# Patient Record
Sex: Female | Born: 1980 | Race: Black or African American | Hispanic: No | Marital: Single | Smoking: Never smoker
Health system: Southern US, Community
[De-identification: ages and names within clinical notes are randomized; demographics above are authoritative.]

## PROBLEM LIST (undated history)

## (undated) DIAGNOSIS — G43909 Migraine, unspecified, not intractable, without status migrainosus: Secondary | ICD-10-CM

## (undated) DIAGNOSIS — D649 Anemia, unspecified: Secondary | ICD-10-CM

## (undated) DIAGNOSIS — B029 Zoster without complications: Secondary | ICD-10-CM

## (undated) HISTORY — DX: Zoster without complications: B02.9

## (undated) HISTORY — PX: OTHER SURGICAL HISTORY: SHX169

---

## 2007-06-21 ENCOUNTER — Emergency Department (HOSPITAL_COMMUNITY): Admission: EM | Admit: 2007-06-21 | Discharge: 2007-06-21 | Payer: Self-pay | Admitting: Emergency Medicine

## 2008-02-25 ENCOUNTER — Emergency Department (HOSPITAL_COMMUNITY): Admission: EM | Admit: 2008-02-25 | Discharge: 2008-02-25 | Payer: Self-pay | Admitting: Emergency Medicine

## 2008-04-10 ENCOUNTER — Emergency Department (HOSPITAL_COMMUNITY): Admission: EM | Admit: 2008-04-10 | Discharge: 2008-04-10 | Payer: Self-pay | Admitting: Emergency Medicine

## 2008-07-30 ENCOUNTER — Emergency Department (HOSPITAL_COMMUNITY): Admission: EM | Admit: 2008-07-30 | Discharge: 2008-07-30 | Payer: Self-pay | Admitting: Emergency Medicine

## 2008-12-10 ENCOUNTER — Emergency Department (HOSPITAL_COMMUNITY): Admission: EM | Admit: 2008-12-10 | Discharge: 2008-12-10 | Payer: Self-pay | Admitting: Emergency Medicine

## 2009-05-01 ENCOUNTER — Ambulatory Visit: Payer: Self-pay | Admitting: Family Medicine

## 2009-07-26 ENCOUNTER — Emergency Department (HOSPITAL_COMMUNITY): Admission: EM | Admit: 2009-07-26 | Discharge: 2009-07-26 | Payer: Self-pay | Admitting: Emergency Medicine

## 2009-10-31 ENCOUNTER — Emergency Department (HOSPITAL_COMMUNITY): Admission: EM | Admit: 2009-10-31 | Discharge: 2009-10-31 | Payer: Self-pay | Admitting: Emergency Medicine

## 2009-11-01 ENCOUNTER — Emergency Department (HOSPITAL_COMMUNITY): Admission: EM | Admit: 2009-11-01 | Discharge: 2009-11-01 | Payer: Self-pay | Admitting: Emergency Medicine

## 2010-01-06 ENCOUNTER — Emergency Department (HOSPITAL_COMMUNITY): Admission: EM | Admit: 2010-01-06 | Discharge: 2010-01-06 | Payer: Self-pay | Admitting: Emergency Medicine

## 2010-03-07 ENCOUNTER — Emergency Department (HOSPITAL_COMMUNITY)
Admission: EM | Admit: 2010-03-07 | Discharge: 2010-03-07 | Payer: Self-pay | Source: Home / Self Care | Admitting: Emergency Medicine

## 2010-03-30 ENCOUNTER — Emergency Department (HOSPITAL_COMMUNITY)
Admission: EM | Admit: 2010-03-30 | Discharge: 2010-03-31 | Payer: Self-pay | Source: Home / Self Care | Admitting: Emergency Medicine

## 2010-04-01 ENCOUNTER — Emergency Department (HOSPITAL_COMMUNITY)
Admission: EM | Admit: 2010-04-01 | Discharge: 2010-04-01 | Payer: Self-pay | Source: Home / Self Care | Admitting: Emergency Medicine

## 2010-04-01 LAB — URINE MICROSCOPIC-ADD ON

## 2010-04-01 LAB — URINALYSIS, ROUTINE W REFLEX MICROSCOPIC
Bilirubin Urine: NEGATIVE
Ketones, ur: NEGATIVE mg/dL
Leukocytes, UA: NEGATIVE
Nitrite: NEGATIVE
Protein, ur: NEGATIVE mg/dL
Specific Gravity, Urine: 1.017 (ref 1.005–1.030)
Urine Glucose, Fasting: NEGATIVE mg/dL
Urobilinogen, UA: 0.2 mg/dL (ref 0.0–1.0)
pH: 6.5 (ref 5.0–8.0)

## 2010-04-03 LAB — URINE CULTURE
Colony Count: 45000
Colony Count: NO GROWTH
Culture  Setup Time: 201201151141
Culture  Setup Time: 201201161814
Culture: NO GROWTH

## 2010-04-03 LAB — URINALYSIS, ROUTINE W REFLEX MICROSCOPIC
Bilirubin Urine: NEGATIVE
Hgb urine dipstick: NEGATIVE
Ketones, ur: NEGATIVE mg/dL
Nitrite: NEGATIVE
Protein, ur: NEGATIVE mg/dL
Specific Gravity, Urine: 1.016 (ref 1.005–1.030)
Urine Glucose, Fasting: NEGATIVE mg/dL
Urobilinogen, UA: 0.2 mg/dL (ref 0.0–1.0)
pH: 7.5 (ref 5.0–8.0)

## 2010-04-03 LAB — POCT PREGNANCY, URINE: Preg Test, Ur: NEGATIVE

## 2010-04-15 ENCOUNTER — Other Ambulatory Visit: Payer: Self-pay | Admitting: Family Medicine

## 2010-04-15 DIAGNOSIS — M7989 Other specified soft tissue disorders: Secondary | ICD-10-CM

## 2010-04-23 ENCOUNTER — Other Ambulatory Visit: Payer: Self-pay | Admitting: Family Medicine

## 2010-04-23 ENCOUNTER — Other Ambulatory Visit: Payer: Self-pay | Admitting: Diagnostic Radiology

## 2010-04-23 ENCOUNTER — Ambulatory Visit
Admission: RE | Admit: 2010-04-23 | Discharge: 2010-04-23 | Disposition: A | Discharge status: Home / Self Care | Payer: Self-pay | Source: Ambulatory Visit | Attending: Family Medicine | Admitting: Family Medicine

## 2010-04-23 DIAGNOSIS — M7989 Other specified soft tissue disorders: Secondary | ICD-10-CM

## 2010-05-03 ENCOUNTER — Ambulatory Visit (HOSPITAL_BASED_OUTPATIENT_CLINIC_OR_DEPARTMENT_OTHER)
Admission: RE | Admit: 2010-05-03 | Discharge: 2010-05-03 | Disposition: A | Discharge status: Home / Self Care | Payer: Self-pay | Source: Ambulatory Visit | Attending: Surgery | Admitting: Surgery

## 2010-05-03 ENCOUNTER — Other Ambulatory Visit: Payer: Self-pay | Admitting: Surgery

## 2010-05-03 DIAGNOSIS — D249 Benign neoplasm of unspecified breast: Secondary | ICD-10-CM | POA: Insufficient documentation

## 2010-05-03 DIAGNOSIS — Z01812 Encounter for preprocedural laboratory examination: Secondary | ICD-10-CM | POA: Insufficient documentation

## 2010-05-03 DIAGNOSIS — J45909 Unspecified asthma, uncomplicated: Secondary | ICD-10-CM | POA: Insufficient documentation

## 2010-05-03 LAB — POCT HEMOGLOBIN-HEMACUE: Hemoglobin: 13.5 g/dL (ref 12.0–15.0)

## 2010-05-07 NOTE — Op Note (Signed)
  NAMESATCHA, STORLIE               ACCOUNT NO.:  [id number]  MEDICAL RECORD NO.:  [id number]           PATIENT TYPE:  LOCATION:                                 FACILITY:  PHYSICIAN:  Currie Paris, M.D.DATE OF BIRTH:  Jun 05, 1980  DATE OF PROCEDURE:  05/03/2010 DATE OF DISCHARGE:                              OPERATIVE REPORT   PREOPERATIVE DIAGNOSIS:  Spindle cell tumor, right axilla.  POSTOPERATIVE DIAGNOSIS:  Spindle cell tumor, right axilla.  OPERATION:  Removal of spindle cell tumor, right axilla.  SURGEON:  Currie Paris, MD  ANESTHESIA:  General.  CLINICAL HISTORY:  This is a 29-year lady who was mostly developed a well-circumscribed firm but not rock-hard mass in the right axilla.  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  The patient had no other abnormalities noted.  DESCRIPTION OF PROCEDURE:  I saw the patient in the holding area and marked the right axilla as the operative site.  She had no further questions, so we proceeded to the operating room.  In the operating room, I was able to extend her arm and marked the exact mass which was actually visible with her arm extended.  Once that was done, satisfactory general anesthesia was obtained and the right axillary area were prepped and draped.  A time-out was done.  I put some 0.25% plain Marcaine in to help with postop pain relief.  I made an incision in the axilla directly over the mass.  Initially, I thought it was very superficial, but as I went a little deeper I found that it was just under the superficial fascia, so I opened that.  As I began to dissect this off, it was clearly well circumscribed mass that was somewhat pale to whitish-yellow.  It was also noted that it looked like some of the axillary brachial plexus was draped a little bit over this and it was sitting basically on the top of the axillary vein  and axillary artery.  Therefore, I was able to dissect this off very carefully without causing any damage to the nerves or artery and it came out intact.  I made sure everything was dry.  There was a small blood vessel that I put 2 clips on, but there was no significant active bleeding.  I elected not to put any further deep local in for fear of anesthetizing the brachial plexus.  Once everything was dry, I closed in layers with 3-0 Vicryl, 4-0 Monocryl subcuticular, and Dermabond.  The patient tolerated the procedure well.  There were no complications. All counts were correct.     Currie Paris, M.D.     CJS/MEDQ  D:  05/03/2010  T:  05/04/2010  Job:  [id number]  cc:   [facility]. Shawnie Pons, M.D.  Electronically Signed by Cyndia Bent M.D. on 05/07/2010 08:35:51 AM

## 2010-05-31 LAB — PREGNANCY, URINE: Preg Test, Ur: NEGATIVE

## 2010-06-04 LAB — DIFFERENTIAL
Basophils Absolute: 0 10*3/uL (ref 0.0–0.1)
Basophils Relative: 0 % (ref 0–1)
Eosinophils Absolute: 0 10*3/uL (ref 0.0–0.7)
Eosinophils Relative: 0 % (ref 0–5)
Lymphocytes Relative: 17 % (ref 12–46)
Lymphs Abs: 1.4 10*3/uL (ref 0.7–4.0)
Monocytes Absolute: 0.5 10*3/uL (ref 0.1–1.0)
Monocytes Relative: 6 % (ref 3–12)
Neutro Abs: 6.5 10*3/uL (ref 1.7–7.7)
Neutrophils Relative %: 77 % (ref 43–77)

## 2010-06-04 LAB — COMPREHENSIVE METABOLIC PANEL
ALT: 34 U/L (ref 0–35)
AST: 18 U/L (ref 0–37)
Albumin: 4 g/dL (ref 3.5–5.2)
Alkaline Phosphatase: 53 U/L (ref 39–117)
BUN: 8 mg/dL (ref 6–23)
CO2: 26 mEq/L (ref 19–32)
Calcium: 9.7 mg/dL (ref 8.4–10.5)
Chloride: 106 mEq/L (ref 96–112)
Creatinine, Ser: 0.53 mg/dL (ref 0.4–1.2)
GFR calc Af Amer: >60 mL/min (ref >60)
GFR calc non Af Amer: >60 mL/min (ref >60)
Glucose, Bld: 84 mg/dL (ref 70–99)
Potassium: 3.9 mEq/L (ref 3.5–5.1)
Sodium: 138 mEq/L (ref 135–145)
Total Bilirubin: 0.9 mg/dL (ref 0.3–1.2)
Total Protein: 7 g/dL (ref 6.0–8.3)

## 2010-06-04 LAB — CBC
HCT: 39.5 % (ref 36.0–46.0)
Hemoglobin: 13.8 g/dL (ref 12.0–15.0)
MCHC: 34.8 g/dL (ref 30.0–36.0)
MCV: 90.5 fL (ref 78.0–100.0)
Platelets: 300 10*3/uL (ref 150–400)
RBC: 4.37 MIL/uL (ref 3.87–5.11)
RDW: 12.9 % (ref 11.5–15.5)
WBC: 8.5 10*3/uL (ref 4.0–10.5)

## 2010-06-04 LAB — URINE MICROSCOPIC-ADD ON

## 2010-06-04 LAB — URINE CULTURE: Colony Count: 75000

## 2010-06-04 LAB — URINALYSIS, ROUTINE W REFLEX MICROSCOPIC
Bilirubin Urine: NEGATIVE
Glucose, UA: NEGATIVE mg/dL
Ketones, ur: NEGATIVE mg/dL
Nitrite: NEGATIVE
Protein, ur: NEGATIVE mg/dL
Specific Gravity, Urine: 1.036 — ABNORMAL HIGH (ref 1.005–1.030)
Urobilinogen, UA: 0.2 mg/dL (ref 0.0–1.0)
pH: 6 (ref 5.0–8.0)

## 2010-06-04 LAB — POCT PREGNANCY, URINE: Preg Test, Ur: NEGATIVE

## 2010-06-04 LAB — LIPASE, BLOOD: Lipase: 22 U/L (ref 11–59)

## 2010-06-15 ENCOUNTER — Emergency Department (HOSPITAL_COMMUNITY)
Admission: EM | Admit: 2010-06-15 | Discharge: 2010-06-15 | Disposition: A | Discharge status: Home / Self Care | Payer: Self-pay | Attending: Emergency Medicine | Admitting: Emergency Medicine

## 2010-06-15 ENCOUNTER — Emergency Department (HOSPITAL_COMMUNITY): Payer: Self-pay

## 2010-06-15 DIAGNOSIS — R110 Nausea: Secondary | ICD-10-CM | POA: Insufficient documentation

## 2010-06-15 DIAGNOSIS — J45909 Unspecified asthma, uncomplicated: Secondary | ICD-10-CM | POA: Insufficient documentation

## 2010-06-15 DIAGNOSIS — G43909 Migraine, unspecified, not intractable, without status migrainosus: Secondary | ICD-10-CM | POA: Insufficient documentation

## 2010-06-25 LAB — URINALYSIS, ROUTINE W REFLEX MICROSCOPIC
Bilirubin Urine: NEGATIVE
Glucose, UA: NEGATIVE mg/dL
Ketones, ur: NEGATIVE mg/dL
Nitrite: NEGATIVE
Protein, ur: NEGATIVE mg/dL
Specific Gravity, Urine: 1.03 (ref 1.005–1.030)
Urobilinogen, UA: 1 mg/dL (ref 0.0–1.0)
pH: 6.5 (ref 5.0–8.0)

## 2010-06-25 LAB — POCT I-STAT, CHEM 8
BUN: 9 mg/dL (ref 6–23)
Calcium, Ion: 1.05 mmol/L — ABNORMAL LOW (ref 1.12–1.32)
Chloride: 107 mEq/L (ref 96–112)
Creatinine, Ser: 0.6 mg/dL (ref 0.4–1.2)
Glucose, Bld: 88 mg/dL (ref 70–99)
HCT: 44 % (ref 36.0–46.0)
Hemoglobin: 15 g/dL (ref 12.0–15.0)
Potassium: 3.6 mEq/L (ref 3.5–5.1)
Sodium: 140 mEq/L (ref 135–145)
TCO2: 25 mmol/L (ref 0–100)

## 2010-06-25 LAB — URINE MICROSCOPIC-ADD ON

## 2010-06-25 LAB — POCT PREGNANCY, URINE: Preg Test, Ur: NEGATIVE

## 2010-08-05 ENCOUNTER — Encounter (INDEPENDENT_AMBULATORY_CARE_PROVIDER_SITE_OTHER): Payer: Self-pay | Admitting: Surgery

## 2010-09-19 ENCOUNTER — Emergency Department (HOSPITAL_COMMUNITY)
Admission: EM | Admit: 2010-09-19 | Discharge: 2010-09-20 | Disposition: A | Discharge status: Home / Self Care | Payer: Self-pay | Attending: Emergency Medicine | Admitting: Emergency Medicine

## 2010-09-19 DIAGNOSIS — G43909 Migraine, unspecified, not intractable, without status migrainosus: Secondary | ICD-10-CM | POA: Insufficient documentation

## 2010-09-19 DIAGNOSIS — H53149 Visual discomfort, unspecified: Secondary | ICD-10-CM | POA: Insufficient documentation

## 2010-09-19 DIAGNOSIS — R112 Nausea with vomiting, unspecified: Secondary | ICD-10-CM | POA: Insufficient documentation

## 2010-10-12 ENCOUNTER — Emergency Department (HOSPITAL_COMMUNITY)
Admission: EM | Admit: 2010-10-12 | Discharge: 2010-10-12 | Disposition: A | Discharge status: Home / Self Care | Payer: Self-pay | Attending: Emergency Medicine | Admitting: Emergency Medicine

## 2010-10-12 DIAGNOSIS — R221 Localized swelling, mass and lump, neck: Secondary | ICD-10-CM | POA: Insufficient documentation

## 2010-10-12 DIAGNOSIS — J45909 Unspecified asthma, uncomplicated: Secondary | ICD-10-CM | POA: Insufficient documentation

## 2010-10-12 DIAGNOSIS — K029 Dental caries, unspecified: Secondary | ICD-10-CM | POA: Insufficient documentation

## 2010-10-12 DIAGNOSIS — R220 Localized swelling, mass and lump, head: Secondary | ICD-10-CM | POA: Insufficient documentation

## 2010-10-12 DIAGNOSIS — K089 Disorder of teeth and supporting structures, unspecified: Secondary | ICD-10-CM | POA: Insufficient documentation

## 2010-10-28 ENCOUNTER — Emergency Department (HOSPITAL_COMMUNITY)
Admission: EM | Admit: 2010-10-28 | Discharge: 2010-10-28 | Disposition: A | Discharge status: Home / Self Care | Payer: Self-pay | Attending: Emergency Medicine | Admitting: Emergency Medicine

## 2010-10-28 DIAGNOSIS — R51 Headache: Secondary | ICD-10-CM | POA: Insufficient documentation

## 2010-10-28 DIAGNOSIS — K089 Disorder of teeth and supporting structures, unspecified: Secondary | ICD-10-CM | POA: Insufficient documentation

## 2010-10-28 DIAGNOSIS — J45909 Unspecified asthma, uncomplicated: Secondary | ICD-10-CM | POA: Insufficient documentation

## 2010-10-28 DIAGNOSIS — R220 Localized swelling, mass and lump, head: Secondary | ICD-10-CM | POA: Insufficient documentation

## 2010-10-28 DIAGNOSIS — K047 Periapical abscess without sinus: Secondary | ICD-10-CM | POA: Insufficient documentation

## 2010-10-28 DIAGNOSIS — K029 Dental caries, unspecified: Secondary | ICD-10-CM | POA: Insufficient documentation

## 2010-10-29 ENCOUNTER — Emergency Department (HOSPITAL_COMMUNITY)
Admission: EM | Admit: 2010-10-29 | Discharge: 2010-10-29 | Disposition: A | Discharge status: Home / Self Care | Payer: Self-pay | Attending: Emergency Medicine | Admitting: Emergency Medicine

## 2010-10-29 DIAGNOSIS — K047 Periapical abscess without sinus: Secondary | ICD-10-CM | POA: Insufficient documentation

## 2010-10-29 DIAGNOSIS — J45909 Unspecified asthma, uncomplicated: Secondary | ICD-10-CM | POA: Insufficient documentation

## 2010-10-29 DIAGNOSIS — K089 Disorder of teeth and supporting structures, unspecified: Secondary | ICD-10-CM | POA: Insufficient documentation

## 2010-12-20 LAB — URINALYSIS, ROUTINE W REFLEX MICROSCOPIC
Glucose, UA: NEGATIVE mg/dL
Ketones, ur: NEGATIVE mg/dL
Protein, ur: NEGATIVE mg/dL

## 2010-12-20 LAB — POCT I-STAT, CHEM 8
Chloride: 106 mEq/L (ref 96–112)
Glucose, Bld: 80 mg/dL (ref 70–99)
HCT: 40 % (ref 36.0–46.0)
Potassium: 3.7 mEq/L (ref 3.5–5.1)

## 2010-12-20 LAB — URINE MICROSCOPIC-ADD ON

## 2010-12-21 ENCOUNTER — Emergency Department (HOSPITAL_COMMUNITY)
Admission: EM | Admit: 2010-12-21 | Discharge: 2010-12-21 | Discharge status: Against Medical Advice | Payer: Self-pay | Attending: Emergency Medicine | Admitting: Emergency Medicine

## 2010-12-21 DIAGNOSIS — J45909 Unspecified asthma, uncomplicated: Secondary | ICD-10-CM | POA: Insufficient documentation

## 2010-12-21 DIAGNOSIS — R1110 Vomiting, unspecified: Secondary | ICD-10-CM | POA: Insufficient documentation

## 2010-12-21 DIAGNOSIS — G43909 Migraine, unspecified, not intractable, without status migrainosus: Secondary | ICD-10-CM | POA: Insufficient documentation

## 2011-01-13 ENCOUNTER — Encounter (INDEPENDENT_AMBULATORY_CARE_PROVIDER_SITE_OTHER): Payer: Self-pay | Admitting: Surgery

## 2011-04-02 ENCOUNTER — Encounter (HOSPITAL_COMMUNITY): Payer: Self-pay | Admitting: *Deleted

## 2011-04-02 ENCOUNTER — Emergency Department (HOSPITAL_COMMUNITY)
Admission: EM | Admit: 2011-04-02 | Discharge: 2011-04-02 | Disposition: A | Discharge status: Home / Self Care | Payer: Self-pay | Attending: Emergency Medicine | Admitting: Emergency Medicine

## 2011-04-02 DIAGNOSIS — H53149 Visual discomfort, unspecified: Secondary | ICD-10-CM | POA: Insufficient documentation

## 2011-04-02 DIAGNOSIS — R112 Nausea with vomiting, unspecified: Secondary | ICD-10-CM | POA: Insufficient documentation

## 2011-04-02 DIAGNOSIS — G43909 Migraine, unspecified, not intractable, without status migrainosus: Secondary | ICD-10-CM | POA: Insufficient documentation

## 2011-04-02 DIAGNOSIS — J45909 Unspecified asthma, uncomplicated: Secondary | ICD-10-CM | POA: Insufficient documentation

## 2011-04-02 HISTORY — DX: Migraine, unspecified, not intractable, without status migrainosus: G43.909

## 2011-04-02 MED ORDER — KETOROLAC TROMETHAMINE 30 MG/ML IJ SOLN: 30.0000 mg | Freq: Once | INTRAMUSCULAR | Status: DC

## 2011-04-02 MED ORDER — SODIUM CHLORIDE 0.9 % IV BOLUS (SEPSIS)
1000.0000 mL | Freq: Once | INTRAVENOUS | Status: AC
  Administered 2011-04-02: 1000 mL via INTRAVENOUS

## 2011-04-02 MED ORDER — KETOROLAC TROMETHAMINE 30 MG/ML IJ SOLN
30.0000 mg | Freq: Once | INTRAMUSCULAR | Status: AC
  Administered 2011-04-02: 30 mg via INTRAVENOUS
  Filled 2011-04-02: qty 1

## 2011-04-02 MED ORDER — DEXAMETHASONE SODIUM PHOSPHATE 10 MG/ML IJ SOLN
10.0000 mg | Freq: Once | INTRAMUSCULAR | Status: AC
  Administered 2011-04-02: 10 mg via INTRAVENOUS
  Filled 2011-04-02: qty 1

## 2011-04-02 MED ORDER — DIPHENHYDRAMINE HCL 50 MG/ML IJ SOLN
12.5000 mg | Freq: Once | INTRAMUSCULAR | Status: AC
  Administered 2011-04-02: 12.5 mg via INTRAVENOUS
  Filled 2011-04-02: qty 1

## 2011-04-02 MED ORDER — METOCLOPRAMIDE HCL 5 MG/ML IJ SOLN
10.0000 mg | Freq: Once | INTRAMUSCULAR | Status: AC
  Administered 2011-04-02: 10 mg via INTRAVENOUS
  Filled 2011-04-02: qty 2

## 2011-04-02 MED ORDER — METOCLOPRAMIDE HCL 10 MG PO TABS: 10.0000 mg | ORAL_TABLET | Freq: Three times a day (TID) | ORAL | Status: DC | PRN

## 2011-04-02 NOTE — ED Notes (Signed)
Pt reports migraine that started last night.  Denies taking medication.  Photophobia, nauseated.

## 2011-04-02 NOTE — ED Notes (Signed)
Pt reports migraine headache started last night. Pt with hx of headaches. Reports nausea, vomiting, and photophobia. Pt in no distress. PERRLA. GCS 15.

## 2011-04-02 NOTE — ED Provider Notes (Signed)
History     CSN: [id number]  Arrival date & time 04/02/11  8413   First MD Initiated Contact with Patient 04/02/11 [phone]      Chief Complaint  Patient presents with  . Migraine    (Consider location/radiation/quality/duration/timing/severity/associated sxs/prior treatment) Patient is a 31 y.o. female presenting with migraine. The history is provided by the patient.  Migraine This is a chronic problem. The current episode started yesterday. Episode frequency: weekly. The problem has been unchanged. Associated symptoms include headaches, nausea and vomiting. Pertinent negatives include no abdominal pain, chest pain, congestion, coughing, fever, neck pain, numbness, rash, sore throat, vertigo or weakness. Exacerbated by: light, sound, exertion. She has tried NSAIDs for the symptoms. The treatment provided no relief.  Bifrontal migraine HA, similar to prior HAs that she gets several times a month.   Past Medical History  Diagnosis Date  . Shingles   . Migraines   . Asthma     History reviewed. No pertinent past surgical history.  History reviewed. No pertinent family history.  History  Substance Use Topics  . Smoking status: Never Smoker   . Smokeless tobacco: Not on file  . Alcohol Use: No     Review of Systems  Constitutional: Negative for fever.  HENT: Negative for congestion, sore throat, neck pain, neck stiffness and tinnitus.   Eyes: Positive for photophobia. Negative for pain.  Respiratory: Negative for cough, chest tightness and shortness of breath.   Cardiovascular: Negative for chest pain.  Gastrointestinal: Positive for nausea and vomiting. Negative for abdominal pain.  Musculoskeletal: Negative for gait problem.  Skin: Negative for rash.  Neurological: Positive for headaches. Negative for dizziness, vertigo, syncope, facial asymmetry, speech difficulty, weakness, light-headedness and numbness.  Psychiatric/Behavioral: Negative for confusion.    Allergies    Review of patient's allergies indicates no known allergies.  Home Medications   Current Outpatient Rx  Name Route Sig Dispense Refill  . IBUPROFEN 200 MG PO TABS Oral Take 200 mg by mouth every 6 (six) hours as needed. Headache, pain or fever    . TETRAHYDROZOLINE HCL 0.05 % OP SOLN Both Eyes Place 1 drop into both eyes 3 (three) times daily. For dry irritated eyes      BP 126/81  Pulse 69  Temp(Src) 97.6 F (36.4 C) (Oral)  Resp 18  SpO2 100%  Physical Exam  Nursing note and vitals reviewed. Constitutional: She is oriented to person, place, and time. She appears well-developed and well-nourished.       Uncomfortable but non-toxic appearing  HENT:  Head: Normocephalic and atraumatic.  Right Ear: Tympanic membrane, external ear and ear canal normal.  Left Ear: Tympanic membrane, external ear and ear canal normal.  Nose: Nose normal.  Mouth/Throat: Oropharynx is clear and moist and mucous membranes are normal. No oropharyngeal exudate.  Eyes: Conjunctivae and EOM are normal. Pupils are equal, round, and reactive to light.       No nystagmus. Visual fields full to confrontation bilaterally.  Neck: Normal range of motion. Neck supple.       No meningismus  Cardiovascular: Normal rate, regular rhythm, normal heart sounds and intact distal pulses.   No murmur heard. Pulmonary/Chest: Effort normal and breath sounds normal. No respiratory distress. She has no wheezes.  Abdominal: Soft. Bowel sounds are normal. She exhibits no distension. There is no tenderness.  Musculoskeletal: She exhibits no edema and no tenderness.  Lymphadenopathy:    She has no cervical adenopathy.  Neurological: She is alert and oriented  to person, place, and time. She has normal strength. No cranial nerve deficit or sensory deficit. She displays a negative Romberg sign. Coordination and gait normal. GCS eye subscore is 4. GCS verbal subscore is 5. GCS motor subscore is 6.  Skin: Skin is warm and dry. No rash  noted.  Psychiatric: She has a normal mood and affect.    ED Course  Procedures (including critical care time)  Labs Reviewed - No data to display No results found.   Dx 1: Migraine HA   MDM  7:15 AM Pt seen and evaluated. Typical migraine. No fever or neck stiffness- do not suspect meningitis. No recent head injury, no focal motor or sensory deficits- do not suspect SDH or SAH. Migraine cocktail ordered.  8:30 AM Pt with no relief of symptoms after reglan, benadryl, decadron, 1L NS Bolus. Toradol ordered.  9:18 AM Pt with significant symptom relief. Requesting discharge.      [address] [place], [place] 04/02/11 [phone]

## 2011-04-03 NOTE — ED Provider Notes (Signed)
Medical screening examination/treatment/procedure(s) were performed by non-physician practitioner and as supervising physician I was immediately available for consultation/collaboration.   Kristof Nadeem M Renleigh Ouellet, DO 04/03/11 0717 

## 2011-05-06 ENCOUNTER — Encounter (HOSPITAL_COMMUNITY): Payer: Self-pay

## 2011-05-06 ENCOUNTER — Emergency Department (HOSPITAL_COMMUNITY)
Admission: EM | Admit: 2011-05-06 | Discharge: 2011-05-06 | Disposition: A | Discharge status: Home / Self Care | Payer: Self-pay | Attending: Emergency Medicine | Admitting: Emergency Medicine

## 2011-05-06 DIAGNOSIS — G43909 Migraine, unspecified, not intractable, without status migrainosus: Secondary | ICD-10-CM | POA: Insufficient documentation

## 2011-05-06 DIAGNOSIS — R110 Nausea: Secondary | ICD-10-CM | POA: Insufficient documentation

## 2011-05-06 DIAGNOSIS — H53149 Visual discomfort, unspecified: Secondary | ICD-10-CM | POA: Insufficient documentation

## 2011-05-06 DIAGNOSIS — R42 Dizziness and giddiness: Secondary | ICD-10-CM | POA: Insufficient documentation

## 2011-05-06 DIAGNOSIS — J45909 Unspecified asthma, uncomplicated: Secondary | ICD-10-CM | POA: Insufficient documentation

## 2011-05-06 MED ORDER — KETOROLAC TROMETHAMINE 30 MG/ML IJ SOLN
30.0000 mg | Freq: Once | INTRAMUSCULAR | Status: AC
  Administered 2011-05-06: 30 mg via INTRAVENOUS
  Filled 2011-05-06: qty 1

## 2011-05-06 MED ORDER — DIPHENHYDRAMINE HCL 50 MG/ML IJ SOLN
25.0000 mg | Freq: Once | INTRAMUSCULAR | Status: AC
  Administered 2011-05-06: 25 mg via INTRAVENOUS
  Filled 2011-05-06: qty 1

## 2011-05-06 MED ORDER — METOCLOPRAMIDE HCL 5 MG/ML IJ SOLN
10.0000 mg | Freq: Once | INTRAMUSCULAR | Status: DC
  Filled 2011-05-06: qty 2

## 2011-05-06 NOTE — Discharge Instructions (Signed)
Please see attached information in order to make a followup with a primary care doctor in the area. If you develop pain in your neck, high fever, changes in vision, or any other worrisome symptoms, please return to the ER.  RESOURCE GUIDE  Dental Problems  Patients with Medicaid: [facility] [phone] [address]                                           [phone] [address] Phone:  [phone]                                                  Phone:  [phone]  If unable to pay or uninsured, contact:  [facility] or [facility]. to become qualified for the adult dental clinic.  Chronic Pain Problems Contact [facility]  [phone] Patients need to be referred by their primary care doctor.  Insufficient Money for Medicine Contact [employer]:  call "211" or [facility] [phone].  No Primary Care Doctor Call [facility]  [phone] Other agencies that provide inexpensive medical care    [facility]  [phone]    [facility]  [phone]    [facility]  [phone]    [facility]  [phone]    [facility]anned [facility]  [phone]    [facility]  [phone]  Psychological Services [facility]  [phone] [facility]  [phone] [facility]   [phone] (emergency services [phone])  Substance Abuse Resources [facility]  [phone] [facility] [phone] [facility] [phone] Floydene Flock [phone] Residential & Outpatient Substance Abuse Program  [phone]  Abuse/Neglect [facility] Child Abuse Hotline 316-445-1322 [facility] Child Abuse Hotline 5705682666 (After Hours)  Emergency Shelter [facility] [phone]  Maternity Homes [facility] at [facility] [place] [facility] [phone] [facility] [phone]  MRSA Hotline  #:   475-215-0952    [facility] Resources  [facility]     [employer]                          [facility] Dept. [address] [place]                       [address]      [address]                                                  Cristobal Goldmann Phone:  [phone]  Phone:  [phone]                 Phone:  [phone]  [facility] Mental Health Phone:  [phone]  [facility] Child Abuse Hotline [phone] (After Hours)    Migraine Headache A migraine headache is an intense, throbbing pain on one or both sides of your head. The exact cause of a migraine headache is not always known. A migraine may be caused when nerves in the brain become irritated and release chemicals that cause swelling within blood vessels, causing pain. Many migraine sufferers have a family history of migraines. Before you get a migraine you may or may not get an aura. An aura is a group of symptoms that can predict the beginning of a migraine. An aura may include:  Visual changes such as:   Flashing lights.   Bright spots or zig-zag lines.   Tunnel vision.   Feelings of numbness.   Trouble talking.   Muscle weakness.  SYMPTOMS  Pain on one or both sides of your head.   Pain that is pulsating or throbbing in nature.   Pain that is severe enough to prevent daily activities.   Pain that is aggravated by any daily physical activity.   Nausea (feeling sick to your stomach), vomiting, or both.   Pain with exposure to bright lights, loud noises, or activity.   General sensitivity to bright lights or loud noises.  MIGRAINE TRIGGERS Examples of triggers of migraine headaches include:   Alcohol.   Smoking.   Stress.   It may be related to menses (female menstruation).   Aged cheeses.   Foods or drinks that contain nitrates, glutamate,  aspartame, or tyramine.   Lack of sleep.   Chocolate.   Caffeine.   Hunger.   Medications such as nitroglycerine (used to treat chest pain), birth control pills, estrogen, and some blood pressure medications.  DIAGNOSIS  A migraine headache is often diagnosed based on:  Symptoms.   Physical examination.   A computerized X-ray scan (computed tomography, CT) of your head.  TREATMENT  Medications can help prevent migraines if they are recurrent or should they become recurrent. Your caregiver can help you with a medication or treatment program that will be helpful to you.   Lying down in a dark, quiet room may be helpful.   Keeping a headache diary may help you find a trend as to what may be triggering your headaches.  SEEK IMMEDIATE MEDICAL CARE IF:   You have confusion, personality changes or seizures.   You have headaches that wake you from sleep.   You have an increased frequency in your headaches.   You have a stiff neck.   You have a loss of vision.   You have muscle weakness.   You start losing your balance or have trouble walking.   You feel faint or pass out.  MAKE SURE YOU:   Understand these instructions.   Will watch your condition.   Will get help right away if you are not doing well or get worse.  Document Released: 03/03/2005 Document Revised: 11/13/2010 Document Reviewed: 10/17/2008 [facility] Patient Information 2012 [place], [place].

## 2011-05-06 NOTE — ED Provider Notes (Signed)
History     CSN: [id number]  Arrival date & time 05/06/11  4098   First MD Initiated Contact with Patient 05/06/11 [phone]      Chief Complaint  Patient presents with  . Migraine    (Consider location/radiation/quality/duration/timing/severity/associated sxs/prior treatment) Patient is a 31 y.o. female presenting with migraine. The history is provided by the patient.  Migraine This is a new problem. The current episode started in the past 7 days. The problem occurs constantly. The problem has been unchanged. Associated symptoms include headaches and nausea. Pertinent negatives include no abdominal pain, anorexia, chest pain, chills, congestion, coughing, fatigue, fever, myalgias, neck pain, numbness, vertigo, visual change, vomiting or weakness. The symptoms are aggravated by nothing.   Pt with PMH migraines presents with headache consistent with her previous migraines which started 3 days ago. It is located in the frontal area bilaterally and is described as a "throbbing" sensation. Has not changed since onset. Accompanied by nausea without vomiting. Additionally has had photo/phonophobia and slight dizziness which is typical for her. Denies fever, chills.  Past Medical History  Diagnosis Date  . Shingles   . Migraines   . Asthma     History reviewed. No pertinent past surgical history.  No family history on file.  History  Substance Use Topics  . Smoking status: Never Smoker   . Smokeless tobacco: Not on file  . Alcohol Use: No    OB History    Grav Para Term Preterm Abortions TAB SAB Ect Mult Living                  Review of Systems  Constitutional: Negative for fever, chills, activity change, appetite change and fatigue.  HENT: Negative for congestion, neck pain and neck stiffness.   Respiratory: Negative for cough.   Cardiovascular: Negative for chest pain.  Gastrointestinal: Positive for nausea. Negative for vomiting, abdominal pain and anorexia.    Musculoskeletal: Negative for myalgias.  Neurological: Positive for dizziness and headaches. Negative for vertigo, weakness and numbness.    Allergies  Review of patient's allergies indicates no known allergies.  Home Medications   Current Outpatient Rx  Name Route Sig Dispense Refill  . GOODY HEADACHE PO Oral Take 1 packet by mouth 2 (two) times daily as needed. For headache    . ADULT MULTIVITAMIN W/MINERALS CH Oral Take 1 tablet by mouth daily.      BP 127/81  Pulse 86  Temp(Src) 98.1 F (36.7 C) (Oral)  Resp 16  Ht 5' (1.524 m)  Wt 135 lb (61.236 kg)  BMI 26.37 kg/m2  SpO2 100%  LMP 03/26/2011  Physical Exam  Nursing note and vitals reviewed. Constitutional: She is oriented to person, place, and time. She appears well-developed and well-nourished.  Non-toxic appearance. No distress.  HENT:  Head: Normocephalic and atraumatic.  Right Ear: External ear normal.  Left Ear: External ear normal.  Nose: Nose normal.  Mouth/Throat: Oropharynx is clear and moist. No oropharyngeal exudate.  Eyes: Conjunctivae and EOM are normal. Pupils are equal, round, and reactive to light. Right eye exhibits no discharge. Left eye exhibits no discharge.  Neck: Normal range of motion. Neck supple. No Brudzinski's sign and no Kernig's sign noted.  Cardiovascular: Normal rate and regular rhythm.  Exam reveals no gallop and no friction rub.   No murmur heard. Pulmonary/Chest: Effort normal and breath sounds normal. No respiratory distress. She exhibits no tenderness.  Abdominal: Soft. Bowel sounds are normal. There is no rebound and no guarding.  Musculoskeletal: Normal range of motion.  Lymphadenopathy:    She has no cervical adenopathy.  Neurological: She is alert and oriented to person, place, and time. No cranial nerve deficit. She exhibits normal muscle tone. Coordination normal.  Skin: Skin is warm and dry. No rash noted. She is not diaphoretic.  Psychiatric: She has a normal mood and  affect.    ED Course  Procedures (including critical care time)  Labs Reviewed - No data to display No results found.   1. Migraine headache       MDM  Pt presents with HA which is consistent with her previous migraines. Her vitals are stable and she is nontoxic appearing. There are no signs of focal neuro deficits or meningismus. Pt was given Toradol and Benadryl to which she responded well. She stated that she felt better and was ready to be discharged. Discussed return precautions.        Grant Fontana, [place] 05/06/11 2029

## 2011-05-06 NOTE — ED Notes (Signed)
Migraine began 3 days ago, does have nausea and dizziness,  Photophobia present.

## 2011-05-07 NOTE — ED Provider Notes (Signed)
Medical screening examination/treatment/procedure(s) were performed by non-physician practitioner and as supervising physician I was immediately available for consultation/collaboration.   Leigh-Ann Leasia Swann, MD 05/07/11 1151 

## 2011-06-21 ENCOUNTER — Emergency Department (HOSPITAL_COMMUNITY)
Admission: EM | Admit: 2011-06-21 | Discharge: 2011-06-21 | Disposition: A | Discharge status: Home / Self Care | Payer: Self-pay | Attending: Emergency Medicine | Admitting: Emergency Medicine

## 2011-06-21 ENCOUNTER — Encounter (HOSPITAL_COMMUNITY): Payer: Self-pay | Admitting: *Deleted

## 2011-06-21 DIAGNOSIS — K029 Dental caries, unspecified: Secondary | ICD-10-CM | POA: Insufficient documentation

## 2011-06-21 DIAGNOSIS — K0889 Other specified disorders of teeth and supporting structures: Secondary | ICD-10-CM

## 2011-06-21 MED ORDER — BUPIVACAINE-EPINEPHRINE PF 0.25-1:200000 % IJ SOLN
INTRAMUSCULAR | Status: AC
  Filled 2011-06-21: qty 30

## 2011-06-21 MED ORDER — PENICILLIN V POTASSIUM 500 MG PO TABS: 500.0000 mg | ORAL_TABLET | Freq: Three times a day (TID) | ORAL | Status: DC

## 2011-06-21 MED ORDER — BUPIVACAINE HCL (PF) 0.25 % IJ SOLN: 50.0000 mL | Freq: Once | INTRAMUSCULAR | Status: DC

## 2011-06-21 MED ORDER — BUPIVACAINE HCL (PF) 0.5 % IJ SOLN
INTRAMUSCULAR | Status: AC
  Administered 2011-06-21: 07:00:00
  Filled 2011-06-21: qty 10

## 2011-06-21 MED ORDER — HYDROCODONE-ACETAMINOPHEN 5-325 MG PO TABS: 1.0000 | ORAL_TABLET | ORAL | Status: DC | PRN

## 2011-06-21 NOTE — Discharge Instructions (Signed)
Take antibiotic as prescribed. Take vicodin as prescribed for severe pain.   Do not drive within four hours of taking this medication (may cause drowsiness or confusion).  Take advil w/ food up to three times a day, as well.  Follow up with a dentist as soon as possible.  Attached is a list of low cost dental clinics in [place] and [place] or you can follow up with the dentist on call (call Monday morning).  You should return to the ER if you develop worsening symptoms or facial swelling.

## 2011-06-21 NOTE — ED Provider Notes (Signed)
Medical screening examination/treatment/procedure(s) were performed by non-physician practitioner and as supervising physician I was immediately available for consultation/collaboration.   Hanley Seamen, MD 06/21/11 [phone]

## 2011-06-21 NOTE — ED Notes (Signed)
Patient with pain in upper left tooth with decay noted to tooth.  Patient states she has a headache along with the pain.  Patient states that she does have history of migraines, but headache does not feel like her regular migraine.  Patient is CAOx3.

## 2011-06-21 NOTE — ED Notes (Signed)
PA at bedside to give pt bupivacaine injection. PA notified that only bupivacaine 0.5% in pyxis and that the .25% contains epi.

## 2011-06-21 NOTE — ED Notes (Signed)
Pt reports a toothache.  Pt has a upper (L) chipped tooth that appears to have a cavity in it.  Pt also reports that her head is starting to hurt, no photophobia.  Hx of migraines.  Took medication yesterday morning.  No distress.

## 2011-06-21 NOTE — ED Provider Notes (Signed)
Medical screening examination/treatment/procedure(s) were performed by non-physician practitioner and as supervising physician I was immediately available for consultation/collaboration.   Hanley Seamen, MD 06/21/11 [phone]

## 2011-06-21 NOTE — ED Provider Notes (Signed)
History     CSN: [id number]  Arrival date & time 06/21/11  0451   First MD Initiated Contact with Patient 06/21/11 [phone]      Chief Complaint  Patient presents with  . Dental Pain    (Consider location/radiation/quality/duration/timing/severity/associated sxs/prior treatment) HPI History provided by pt.   Pt c/o left upper toothache x 2 days.  Pain radiates to left side of face and head.  No relief w/ advil.  No associated fever.  Does not currently have a dentist.   Past Medical History  Diagnosis Date  . Shingles   . Migraines   . Asthma     History reviewed. No pertinent past surgical history.  History reviewed. No pertinent family history.  History  Substance Use Topics  . Smoking status: Never Smoker   . Smokeless tobacco: Not on file  . Alcohol Use: No    OB History    Grav Para Term Preterm Abortions TAB SAB Ect Mult Living                  Review of Systems  All other systems reviewed and are negative.    Allergies  Review of patient's allergies indicates no known allergies.  Home Medications   Current Outpatient Rx  Name Route Sig Dispense Refill  . ADULT MULTIVITAMIN W/MINERALS CH Oral Take 1 tablet by mouth daily.      BP 146/100  Pulse 93  Temp(Src) 98.7 F (37.1 C) (Oral)  Resp 18  SpO2 100%  Physical Exam  Nursing note and vitals reviewed. Constitutional: She is oriented to person, place, and time. She appears well-developed and well-nourished.  HENT:  Head: Normocephalic and atraumatic. No trismus in the jaw.  Mouth/Throat: Uvula is midline and oropharynx is clear and moist.       Diffuse dental decay.  Left upper 2nd premolar partially avulsed and severely decayed.  Tender to palpation.  Adjacent gingiva appears normal and no edema of buccal mucosa.    Eyes:       Normal appearance  Neck: Normal range of motion. Neck supple.  Lymphadenopathy:    She has no cervical adenopathy.  Neurological: She is alert and oriented to person,  place, and time.  Psychiatric: She has a normal mood and affect. Her behavior is normal.    ED Course  Dental Performed by: Otilio Miu Authorized by: Ruby Cola E Consent: Verbal consent obtained. Consent given by: patient Local anesthesia used: yes Anesthesia: local infiltration Local anesthetic: bupivacaine 0.25% without epinephrine Patient tolerance: Patient tolerated the procedure well with no immediate complications. Comments: Injection at left upper 2nd premolar   (including critical care time)  Labs Reviewed - No data to display No results found.   1. Pain, dental       MDM  Pt presents w/ pain of left upper second premolar.  Will treat for possible periapical abscess w/ penicillin as well as vicodin for pain. Pt received local bupivacaine in ED w/ relief.  Referred to dentist on call as well as several low cost dental clinics in [place].  Return precautions discussed.         Otilio Miu, [place] 06/21/11 [phone]

## 2011-06-22 ENCOUNTER — Encounter (HOSPITAL_COMMUNITY): Payer: Self-pay | Admitting: *Deleted

## 2011-06-22 ENCOUNTER — Emergency Department (HOSPITAL_COMMUNITY)
Admission: EM | Admit: 2011-06-22 | Discharge: 2011-06-22 | Disposition: A | Discharge status: Home / Self Care | Payer: Self-pay | Attending: Emergency Medicine | Admitting: Emergency Medicine

## 2011-06-22 DIAGNOSIS — K047 Periapical abscess without sinus: Secondary | ICD-10-CM

## 2011-06-22 DIAGNOSIS — G43909 Migraine, unspecified, not intractable, without status migrainosus: Secondary | ICD-10-CM | POA: Insufficient documentation

## 2011-06-22 DIAGNOSIS — J45909 Unspecified asthma, uncomplicated: Secondary | ICD-10-CM | POA: Insufficient documentation

## 2011-06-22 MED ORDER — KETOROLAC TROMETHAMINE 60 MG/2ML IM SOLN
60.0000 mg | Freq: Once | INTRAMUSCULAR | Status: AC
  Administered 2011-06-22: 60 mg via INTRAMUSCULAR
  Filled 2011-06-22: qty 2

## 2011-06-22 MED ORDER — OXYCODONE-ACETAMINOPHEN 5-325 MG PO TABS: 1.0000 | ORAL_TABLET | ORAL | Status: AC | PRN

## 2011-06-22 MED ORDER — DIPHENHYDRAMINE HCL 25 MG PO CAPS
50.0000 mg | ORAL_CAPSULE | Freq: Once | ORAL | Status: AC
  Administered 2011-06-22: 50 mg via ORAL
  Filled 2011-06-22: qty 2

## 2011-06-22 MED ORDER — METOCLOPRAMIDE HCL 5 MG/ML IJ SOLN
10.0000 mg | Freq: Once | INTRAMUSCULAR | Status: AC
  Administered 2011-06-22: 10 mg via INTRAMUSCULAR
  Filled 2011-06-22: qty 2

## 2011-06-22 MED ORDER — PENICILLIN V POTASSIUM 500 MG PO TABS: 500.0000 mg | ORAL_TABLET | Freq: Three times a day (TID) | ORAL | Status: AC

## 2011-06-22 NOTE — ED Notes (Signed)
Pt was seen here yesterday for bad toothache and headache and told to come back if worse.  Pt sts not slept in three days

## 2011-06-22 NOTE — ED Provider Notes (Signed)
Medical screening examination/treatment/procedure(s) were performed by non-physician practitioner and as supervising physician I was immediately available for consultation/collaboration.   Sonnet Rizor A Billiejean Schimek, MD 06/22/11 2013 

## 2011-06-22 NOTE — Discharge Instructions (Signed)
Abscessed Tooth A tooth abscess is a collection of infected fluid (pus) from a bacterial infection in the inner part of the tooth (pulp). It usually occurs at the end of the tooth's root.  CAUSES   A very bad cavity (extensive tooth decay).   Trauma to the tooth, such as a broken or chipped tooth, that allows bacteria to enter into the pulp.  SYMPTOMS  Severe pain in and around the infected tooth.   Swelling and redness around the abscessed tooth or in the mouth or face.   Tenderness.   Pus drainage.   Bad breath.   Bitter taste in the mouth.   Difficulty swallowing.   Difficulty opening the mouth.   Feeling sick to your stomach (nauseous).   Vomiting.   Chills.   Swollen neck glands.  DIAGNOSIS  A medical and dental history will be taken.   An examination will be performed by tapping on the abscessed tooth.   X-rays may be taken of the tooth to identify the abscess.  TREATMENT The goal of treatment is to eliminate the infection.   You may be prescribed antibiotic medicine to stop the infection from spreading.   A root canal may be performed to save the tooth. If the tooth cannot be saved, it may be pulled (extracted) and the abscess may be drained.  HOME CARE INSTRUCTIONS  Only take over-the-counter or prescription medicines for pain, fever, or discomfort as directed by your caregiver.   Do not drive after taking pain medicine (narcotics).   Rinse your mouth (gargle) often with salt water ( tsp salt in 8 oz of warm water) to relieve pain or swelling.   Do not apply heat to the outside of your face.   Return to your dentist for further treatment as directed.  SEEK IMMEDIATE DENTAL CARE IF:  You have a temperature by mouth above 102 F (38.9 C), not controlled by medicine.   You have chills or a very bad headache.   You have problems breathing or swallowing.   Your have trouble opening your mouth.   You develop swelling in the neck or around the eye.    Your pain is not helped by medicine.   Your pain is getting worse instead of better.  Document Released: 03/03/2005 Document Revised: 02/20/2011 Document Reviewed: 06/11/2010 [facility] Patient Information 2012 [place], [place].  Migraine Headache A migraine headache is an intense, throbbing pain on one or both sides of your head. The exact cause of a migraine headache is not always known. A migraine may be caused when nerves in the brain become irritated and release chemicals that cause swelling within blood vessels, causing pain. Many migraine sufferers have a family history of migraines. Before you get a migraine you may or may not get an aura. An aura is a group of symptoms that can predict the beginning of a migraine. An aura may include:  Visual changes such as:   Flashing lights.   Bright spots or zig-zag lines.   Tunnel vision.   Feelings of numbness.   Trouble talking.   Muscle weakness.  SYMPTOMS  Pain on one or both sides of your head.   Pain that is pulsating or throbbing in nature.   Pain that is severe enough to prevent daily activities.   Pain that is aggravated by any daily physical activity.   Nausea (feeling sick to your stomach), vomiting, or both.   Pain with exposure to bright lights, loud noises, or activity.  General sensitivity to bright lights or loud noises.  MIGRAINE TRIGGERS Examples of triggers of migraine headaches include:   Alcohol.   Smoking.   Stress.   It may be related to menses (female menstruation).   Aged cheeses.   Foods or drinks that contain nitrates, glutamate, aspartame, or tyramine.   Lack of sleep.   Chocolate.   Caffeine.   Hunger.   Medications such as nitroglycerine (used to treat chest pain), birth control pills, estrogen, and some blood pressure medications.  DIAGNOSIS  A migraine headache is often diagnosed based on:  Symptoms.   Physical examination.   A computerized X-ray scan (computed  tomography, CT) of your head.  TREATMENT  Medications can help prevent migraines if they are recurrent or should they become recurrent. Your caregiver can help you with a medication or treatment program that will be helpful to you.   Lying down in a dark, quiet room may be helpful.   Keeping a headache diary may help you find a trend as to what may be triggering your headaches.  SEEK IMMEDIATE MEDICAL CARE IF:   You have confusion, personality changes or seizures.   You have headaches that wake you from sleep.   You have an increased frequency in your headaches.   You have a stiff neck.   You have a loss of vision.   You have muscle weakness.   You start losing your balance or have trouble walking.   You feel faint or pass out.  MAKE SURE YOU:   Understand these instructions.   Will watch your condition.   Will get help right away if you are not doing well or get worse.  Document Released: 03/03/2005 Document Revised: 02/20/2011 Document Reviewed: 10/17/2008 [facility] Patient Information 2012 [place], [place].

## 2011-06-22 NOTE — ED Provider Notes (Signed)
History     CSN: [id number]  Arrival date & time 06/22/11  1438   None     Chief Complaint  Patient presents with  . Migraine    (Consider location/radiation/quality/duration/timing/severity/associated sxs/prior treatment) Patient is a 31 y.o. female presenting with migraine. The history is provided by the patient.  Migraine This is a new problem. The current episode started yesterday. The problem occurs constantly. The problem has been unchanged. Associated symptoms include headaches and nausea. Pertinent negatives include no chills, fever or vomiting. Associated symptoms comments: She was seen yesterday for dental infection and prescribed Vicodin for pain. She reports the pain of the tooth has caused a migraine. Nausea without vomiting. No fever. She now has mild facial swelling as well..    Past Medical History  Diagnosis Date  . Shingles   . Migraines   . Asthma     History reviewed. No pertinent past surgical history.  No family history on file.  History  Substance Use Topics  . Smoking status: Never Smoker   . Smokeless tobacco: Not on file  . Alcohol Use: No    OB History    Grav Para Term Preterm Abortions TAB SAB Ect Mult Living                  Review of Systems  Constitutional: Negative for fever and chills.  HENT: Positive for dental problem.   Eyes: Positive for photophobia.  Respiratory: Negative.   Cardiovascular: Negative.   Gastrointestinal: Positive for nausea. Negative for vomiting.  Musculoskeletal: Negative.   Skin: Negative.   Neurological: Positive for headaches.    Allergies  Review of patient's allergies indicates no known allergies.  Home Medications   Current Outpatient Rx  Name Route Sig Dispense Refill  . HYDROCODONE-ACETAMINOPHEN 5-325 MG PO TABS Oral Take 1 tablet by mouth every 4 (four) hours as needed. For pain.    . ADULT MULTIVITAMIN W/MINERALS CH Oral Take 1 tablet by mouth daily.    Marland Kitchen PENICILLIN V POTASSIUM 500 MG PO  TABS Oral Take 500 mg by mouth 3 (three) times daily.      BP 110/70  Pulse 89  Temp(Src) 98.3 F (36.8 C) (Oral)  Resp 16  SpO2 98%  Physical Exam  Constitutional: She is oriented to person, place, and time. She appears well-developed and well-nourished.  HENT:  Head: Normocephalic.       Mild left maxillary swelling without discrete abscess. Dental tenderness to upper left incisor. No active drainage.   Eyes: Pupils are equal, round, and reactive to light.  Neck: Normal range of motion. Neck supple.  Cardiovascular: Normal rate and regular rhythm.   Pulmonary/Chest: Effort normal and breath sounds normal.  Abdominal: Soft. Bowel sounds are normal. There is no tenderness. There is no rebound and no guarding.  Musculoskeletal: Normal range of motion.  Neurological: She is alert and oriented to person, place, and time. She has normal strength and normal reflexes. No cranial nerve deficit or sensory deficit. She displays a negative Romberg sign. Coordination normal.  Skin: Skin is warm and dry. No rash noted.  Psychiatric: She has a normal mood and affect.    ED Course  Procedures (including critical care time)  Labs Reviewed - No data to display No results found.   No diagnosis found. 1. Migraine 2. Dental abscess.   MDM  Headache persists but she states she just wants to go home. Discussed treatment options. Will prescribe abx for tooth and Percocet for pain.  She is encouraged to return if symptoms worsen.        Rodena Medin, PA-C 06/22/11 1857

## 2011-08-29 ENCOUNTER — Emergency Department (HOSPITAL_COMMUNITY)
Admission: EM | Admit: 2011-08-29 | Discharge: 2011-08-29 | Disposition: A | Discharge status: Home / Self Care | Payer: Self-pay | Attending: Emergency Medicine | Admitting: Emergency Medicine

## 2011-08-29 ENCOUNTER — Emergency Department (HOSPITAL_COMMUNITY): Payer: Self-pay

## 2011-08-29 ENCOUNTER — Encounter (HOSPITAL_COMMUNITY): Payer: Self-pay | Admitting: Emergency Medicine

## 2011-08-29 DIAGNOSIS — M79609 Pain in unspecified limb: Secondary | ICD-10-CM | POA: Insufficient documentation

## 2011-08-29 DIAGNOSIS — M79673 Pain in unspecified foot: Secondary | ICD-10-CM

## 2011-08-29 MED ORDER — IBUPROFEN 600 MG PO TABS: 600.0000 mg | ORAL_TABLET | Freq: Four times a day (QID) | ORAL | Status: AC | PRN

## 2011-08-29 NOTE — ED Notes (Signed)
Pt c/o pain to dorsal aspect of left foot since Monday. Swelling noted around great toe which pt reports as chronic condition. States pain is located on lateral aspect of foot. Denies known injury and describes pain as "stabbing"/ pt resting with family at bedside. Denies further needs at this time

## 2011-08-29 NOTE — Discharge Instructions (Signed)
Take motrin as need for pain.  Follow up with primary care doctor/podiatrist in 1 week regarding foot pain, as well as for follow up regarding the chronically swollen area to foot. Return to ER if worse, severe foot pain, increased swelling/redness, fevers, other concern.

## 2011-08-29 NOTE — ED Provider Notes (Signed)
History     CSN: [id number]  Arrival date & time 08/29/11  8657   First MD Initiated Contact with Patient 08/29/11 [phone]      Chief Complaint  Patient presents with  . Foot Pain    (Consider location/radiation/quality/duration/timing/severity/associated sxs/prior treatment) Patient is a 31 y.o. female presenting with lower extremity pain. The history is provided by the patient.  Foot Pain  pt c/o left mid to distal foot pain laterally for past 2-3 days. Denies specific injury. Constant, dull, non radiating. Denies hx same. No ankle pain. No skin changes, increased warmth or erythema. No hx gout. No numbness or loss of rom. No fever or chills. States has also had chronically swollen, non painful, area distal left foot medially/around base great toe for several years - no recent change in this area. No hx arthritis or gout.   Past Medical History  Diagnosis Date  . Shingles   . Migraines   . Asthma     Past Surgical History  Procedure Date  . Arm surgery     No family history on file.  History  Substance Use Topics  . Smoking status: Never Smoker   . Smokeless tobacco: Not on file  . Alcohol Use: No    OB History    Grav Para Term Preterm Abortions TAB SAB Ect Mult Living                  Review of Systems  Constitutional: Negative for fever and chills.  Cardiovascular: Negative for leg swelling.  Skin: Negative for rash and wound.  Neurological: Negative for numbness.    Allergies  Review of patient's allergies indicates no known allergies.  Home Medications   Current Outpatient Rx  Name Route Sig Dispense Refill  . BIOTIN 10 MG PO CAPS Oral Take 1 tablet by mouth daily.    . ADULT MULTIVITAMIN W/MINERALS CH Oral Take 1 tablet by mouth daily.      BP 124/77  Pulse 72  Temp 97.6 F (36.4 C) (Oral)  Resp 18  SpO2 100%  LMP 08/27/2011  Physical Exam  Nursing note and vitals reviewed. Constitutional: She is oriented to person, place, and time. She  appears well-developed and well-nourished. No distress.  Eyes: Conjunctivae are normal. No scleral icterus.  Neck: Neck supple. No tracheal deviation present.  Cardiovascular: Normal rate.   Pulmonary/Chest: Effort normal. No respiratory distress.  Abdominal: Normal appearance.  Musculoskeletal: Normal range of motion.       Left ankle - no ankle/malleolar tenderness, ankle stable. Dp/pt 2+. Normal cap refill distally in all toes. Skin intact, no lesions or wounds noted. No increased warmth or erythema. Pt w soft fleshy non tender area along 1st and 2nd mt (pt notes baseline appearance to area x yrs). No discrete mass or bony tenderness. Pt w tenderness left 4th and 5th metatarsals of left foot.   Neurological: She is alert and oriented to person, place, and time.       Motor/sens intact left foot.   Skin: Skin is warm and dry. No rash noted.  Psychiatric: She has a normal mood and affect.    ED Course  Procedures (including critical care time)  Results for orders placed during the hospital encounter of 05/03/10  POCT HEMOGLOBIN-HEMACUE      Component Value Range   Hemoglobin 13.5  12.0 - 15.0 g/dL   Dg Foot Complete Left  08/29/2011  *RADIOLOGY REPORT*  Clinical Data: Lateral foot pain.  Chronic swelling top  of foot. No known injury.  LEFT FOOT - COMPLETE 3+ VIEW  Comparison: None.  Findings: The mineralization and alignment are normal.  There is no evidence of acute fracture or dislocation.  The joint spaces appear maintained.  The medial sesamoid of the first metatarsal is bipartite.  There is focal soft tissue fullness along the dorsal aspect of the forefoot.  No foreign body is identified.  IMPRESSION: No osseous abnormalities identified.  Focal soft tissue fullness along the dorsum of the forefoot.  Original Report Authenticated By: Gerrianne Scale, M.D.      MDM  Xray.   Tenderness along 5th mt on recheck. No skin changes. No cellulitis.  Will recommend nsaids, pcp/podiatry  follow up re current symptoms and was as for f/u st prominence. No ankle or lower leg pain or swelling.       Suzi Roots, MD 08/29/11 [phone]

## 2011-08-29 NOTE — ED Notes (Signed)
Pt reports "If it's going to be too long, I need to leave".  Radiology tech at bedside, pt able to ambulate to radiology.

## 2011-08-29 NOTE — ED Notes (Signed)
PT. REPORTS LEFT FOOT PAIN FOR 2 DAYS WITH SWELLING , DENIES INJURY , AMBULATORY.

## 2011-09-16 ENCOUNTER — Ambulatory Visit
Admission: RE | Admit: 2011-09-16 | Discharge: 2011-09-16 | Disposition: A | Discharge status: Home / Self Care | Payer: No Typology Code available for payment source | Source: Ambulatory Visit | Attending: Family Medicine | Admitting: Family Medicine

## 2011-09-16 ENCOUNTER — Other Ambulatory Visit: Payer: Self-pay | Admitting: Family Medicine

## 2011-09-16 DIAGNOSIS — N63 Unspecified lump in unspecified breast: Secondary | ICD-10-CM

## 2012-07-10 ENCOUNTER — Emergency Department (HOSPITAL_COMMUNITY)
Admission: EM | Admit: 2012-07-10 | Discharge: 2012-07-10 | Disposition: A | Discharge status: Home / Self Care | Payer: Self-pay | Attending: Emergency Medicine | Admitting: Emergency Medicine

## 2012-07-10 DIAGNOSIS — J45909 Unspecified asthma, uncomplicated: Secondary | ICD-10-CM | POA: Insufficient documentation

## 2012-07-10 DIAGNOSIS — K0889 Other specified disorders of teeth and supporting structures: Secondary | ICD-10-CM

## 2012-07-10 DIAGNOSIS — H53149 Visual discomfort, unspecified: Secondary | ICD-10-CM | POA: Insufficient documentation

## 2012-07-10 DIAGNOSIS — K089 Disorder of teeth and supporting structures, unspecified: Secondary | ICD-10-CM | POA: Insufficient documentation

## 2012-07-10 DIAGNOSIS — R51 Headache: Secondary | ICD-10-CM | POA: Insufficient documentation

## 2012-07-10 DIAGNOSIS — Z8619 Personal history of other infectious and parasitic diseases: Secondary | ICD-10-CM | POA: Insufficient documentation

## 2012-07-10 DIAGNOSIS — R112 Nausea with vomiting, unspecified: Secondary | ICD-10-CM | POA: Insufficient documentation

## 2012-07-10 MED ORDER — DIPHENHYDRAMINE HCL 50 MG/ML IJ SOLN
25.0000 mg | Freq: Once | INTRAMUSCULAR | Status: AC
  Administered 2012-07-10: 25 mg via INTRAVENOUS
  Filled 2012-07-10: qty 1

## 2012-07-10 MED ORDER — METOCLOPRAMIDE HCL 5 MG/ML IJ SOLN
10.0000 mg | Freq: Once | INTRAMUSCULAR | Status: AC
  Administered 2012-07-10: 10 mg via INTRAVENOUS
  Filled 2012-07-10: qty 2

## 2012-07-10 NOTE — ED Notes (Signed)
Pt walked out of ER- stopped at triage. This nurse went to speak with pt. Pt states she needs something for her tooth pain. RN offered to speak with MD and explained that if she still wanted to leave she would be leaving prior to D/C instructions and the IV would need to be removed. Pt requesting to leave. IV removed. Pt ambulatory.

## 2012-07-10 NOTE — ED Provider Notes (Signed)
History     CSN: [id number]  Arrival date & time 07/10/12  0444   First MD Initiated Contact with Patient 07/10/12 0522      Chief Complaint  Patient presents with  . Migraine     Patient is a 32 y.o. female presenting with migraines. The history is provided by the patient.  Migraine This is a recurrent problem. The current episode started more than 2 days ago. The problem occurs constantly. The problem has been gradually worsening. Associated symptoms include headaches. Pertinent negatives include no chest pain, no abdominal pain and no shortness of breath. Exacerbated by: light. Nothing relieves the symptoms. She has tried rest for the symptoms. The treatment provided no relief.  pt reports typical headache for past 3 days Denies fever, denies focal weakness.  Similar to prior headaches No visual loss, only blurred vision. No h/o CVA reported  She also reports dental pain recently but unable to see dentist  Past Medical History  Diagnosis Date  . Shingles   . Migraines   . Asthma     Past Surgical History  Procedure Laterality Date  . Arm surgery      No family history on file.  History  Substance Use Topics  . Smoking status: Never Smoker   . Smokeless tobacco: Not on file  . Alcohol Use: No    OB History   Grav Para Term Preterm Abortions TAB SAB Ect Mult Living                  Review of Systems  Constitutional: Negative for fever.  HENT: Negative for neck pain and neck stiffness.   Eyes: Positive for photophobia.  Respiratory: Negative for shortness of breath.   Cardiovascular: Negative for chest pain.  Gastrointestinal: Positive for nausea and vomiting. Negative for abdominal pain.  Neurological: Positive for headaches. Negative for speech difficulty and weakness.  All other systems reviewed and are negative.    Allergies  Review of patient's allergies indicates no known allergies.  Home Medications   Current Outpatient Rx  Name  Route  Sig   Dispense  Refill  . Biotin 10 MG CAPS   Oral   Take 1 tablet by mouth daily.         . Multiple Vitamin (MULITIVITAMIN WITH MINERALS) TABS   Oral   Take 1 tablet by mouth daily.           BP 142/90  Temp(Src) 98.5 F (36.9 C) (Oral)  Resp 21  SpO2 99%  LMP 06/21/2012  Physical Exam CONSTITUTIONAL: Well developed/well nourished HEAD: Normocephalic/atraumatic EYES: EOMI/PERRL, no nystagmus,  ENMT: Mucous membranes moist, poor dentition no focal abscess, no trismus noted NECK: supple no meningeal signs, no bruits SPINE:entire spine nontender CV: S1/S2 noted, no murmurs/rubs/gallops noted LUNGS: Lungs are clear to auscultation bilaterally, no apparent distress ABDOMEN: soft, nontender, no rebound or guarding GU:no cva tenderness NEURO:Awake/alert, facies symmetric, no arm or leg drift is noted Cranial nerves 3/4/5/6/09/22/08/11/12 tested and intact Gait normal without ataxia No past pointing EXTREMITIES: pulses normal, full ROM SKIN: warm, color normal PSYCH: no abnormalities of mood noted   ED Course  Procedures   Pt left soon after meds given She was otherwise stable for d/c Doubt acute neurologic process given she reported h/o frequent migraines   MDM  Nursing notes including past medical history and social history reviewed and considered in documentation         Joya Gaskins, MD 07/10/12 [phone]

## 2012-07-10 NOTE — ED Notes (Addendum)
Pt reports migraine headache in temporal lobes with light sensitivity and blurred vision x 3 days. Describes pain as throbbing. nv accompanied.  Pt also reports lower right molar pain due to 2 broken teeth

## 2013-03-30 ENCOUNTER — Encounter (HOSPITAL_COMMUNITY): Payer: Self-pay | Admitting: Emergency Medicine

## 2013-03-30 ENCOUNTER — Emergency Department (HOSPITAL_COMMUNITY)
Admission: EM | Admit: 2013-03-30 | Discharge: 2013-03-30 | Disposition: A | Discharge status: Home / Self Care | Payer: Self-pay | Attending: Emergency Medicine | Admitting: Emergency Medicine

## 2013-03-30 ENCOUNTER — Emergency Department (HOSPITAL_COMMUNITY): Payer: Self-pay

## 2013-03-30 DIAGNOSIS — R05 Cough: Secondary | ICD-10-CM

## 2013-03-30 DIAGNOSIS — Z8679 Personal history of other diseases of the circulatory system: Secondary | ICD-10-CM | POA: Insufficient documentation

## 2013-03-30 DIAGNOSIS — R0982 Postnasal drip: Secondary | ICD-10-CM | POA: Insufficient documentation

## 2013-03-30 DIAGNOSIS — R059 Cough, unspecified: Secondary | ICD-10-CM

## 2013-03-30 DIAGNOSIS — J45909 Unspecified asthma, uncomplicated: Secondary | ICD-10-CM | POA: Insufficient documentation

## 2013-03-30 DIAGNOSIS — J3489 Other specified disorders of nose and nasal sinuses: Secondary | ICD-10-CM | POA: Insufficient documentation

## 2013-03-30 DIAGNOSIS — IMO0002 Reserved for concepts with insufficient information to code with codable children: Secondary | ICD-10-CM | POA: Insufficient documentation

## 2013-03-30 DIAGNOSIS — R0981 Nasal congestion: Secondary | ICD-10-CM

## 2013-03-30 DIAGNOSIS — Z8619 Personal history of other infectious and parasitic diseases: Secondary | ICD-10-CM | POA: Insufficient documentation

## 2013-03-30 DIAGNOSIS — Z79899 Other long term (current) drug therapy: Secondary | ICD-10-CM | POA: Insufficient documentation

## 2013-03-30 DIAGNOSIS — J029 Acute pharyngitis, unspecified: Secondary | ICD-10-CM | POA: Insufficient documentation

## 2013-03-30 MED ORDER — BENZONATATE 100 MG PO CAPS: 100.0000 mg | ORAL_CAPSULE | Freq: Three times a day (TID) | ORAL | Status: DC

## 2013-03-30 MED ORDER — ALBUTEROL SULFATE HFA 108 (90 BASE) MCG/ACT IN AERS: 2.0000 | INHALATION_SPRAY | RESPIRATORY_TRACT | Status: DC | PRN

## 2013-03-30 MED ORDER — ALBUTEROL SULFATE (2.5 MG/3ML) 0.083% IN NEBU
5.0000 mg | INHALATION_SOLUTION | Freq: Once | RESPIRATORY_TRACT | Status: AC
  Administered 2013-03-30: 5 mg via RESPIRATORY_TRACT
  Filled 2013-03-30: qty 6

## 2013-03-30 MED ORDER — FLUTICASONE PROPIONATE 50 MCG/ACT NA SUSP: 2.0000 | Freq: Every day | NASAL | Status: DC

## 2013-03-30 MED ORDER — DIPHENHYDRAMINE HCL 25 MG PO TABS: 25.0000 mg | ORAL_TABLET | Freq: Four times a day (QID) | ORAL | Status: DC

## 2013-03-30 MED ORDER — IPRATROPIUM BROMIDE 0.02 % IN SOLN
0.5000 mg | Freq: Once | RESPIRATORY_TRACT | Status: AC
  Administered 2013-03-30: 0.5 mg via RESPIRATORY_TRACT
  Filled 2013-03-30: qty 2.5

## 2013-03-30 NOTE — ED Provider Notes (Signed)
CSN: [id number]     Arrival date & time 03/30/13  7425 History   First MD Initiated Contact with Patient 03/30/13 [phone]     Chief Complaint  Patient presents with  . Cough  . Nasal Congestion  . Sore Throat   (Consider location/radiation/quality/duration/timing/severity/associated sxs/prior Treatment) HPI Comments: Pt is a 33 y/o female who presents to the ED complaining of cough, nasal and chest congestion x 2 weeks. States cough productive with green sputum and has been blowing green mucus from her nose. Initially she had a "tickle" in the back of her throat, now her throat only hurts when she coughs. She has tried multiple over the counter medications without relief. Denies fever, chills, n/v/d, chest pain or sob. No sick contacts. She did not have a flu vaccine.  Patient is a 33 y.o. female presenting with cough and pharyngitis. The history is provided by the patient.  Cough Sore Throat Associated symptoms include congestion and coughing.    Past Medical History  Diagnosis Date  . Shingles   . Migraines   . Asthma    Past Surgical History  Procedure Laterality Date  . Arm surgery     No family history on file. History  Substance Use Topics  . Smoking status: Never Smoker   . Smokeless tobacco: Not on file  . Alcohol Use: No   OB History   Grav Para Term Preterm Abortions TAB SAB Ect Mult Living                 Review of Systems  HENT: Positive for congestion.   Respiratory: Positive for cough.   All other systems reviewed and are negative.    Allergies  Review of patient's allergies indicates no known allergies.  Home Medications   Current Outpatient Rx  Name  Route  Sig  Dispense  Refill  . benzonatate (TESSALON) 100 MG capsule   Oral   Take 1 capsule (100 mg total) by mouth every 8 (eight) hours.   21 capsule   0   . fluticasone (FLONASE) 50 MCG/ACT nasal spray   Each Nare   Place 2 sprays into both nostrils daily.   16 g   0    BP 128/66   Pulse 79  Temp(Src) 97.9 F (36.6 C) (Oral)  SpO2 100%  LMP 03/18/2013 Physical Exam  Nursing note and vitals reviewed. Constitutional: She is oriented to person, place, and time. She appears well-developed and well-nourished. No distress.  HENT:  Head: Normocephalic and atraumatic.  Right Ear: Tympanic membrane and ear canal normal.  Left Ear: Tympanic membrane and ear canal normal.  Nose: Mucosal edema present. Right sinus exhibits no maxillary sinus tenderness and no frontal sinus tenderness. Left sinus exhibits no maxillary sinus tenderness and no frontal sinus tenderness.  Mouth/Throat: No oropharyngeal exudate, posterior oropharyngeal edema or posterior oropharyngeal erythema.  Post nasal drip.  Eyes: Conjunctivae are normal.  Neck: Normal range of motion. Neck supple.  Cardiovascular: Normal rate, regular rhythm and normal heart sounds.   Pulmonary/Chest: Effort normal and breath sounds normal.  Musculoskeletal: Normal range of motion. She exhibits no edema.  Neurological: She is alert and oriented to person, place, and time.  Skin: Skin is warm and dry. She is not diaphoretic.  Psychiatric: She has a normal mood and affect. Her behavior is normal.    ED Course  Procedures (including critical care time) Labs Review Labs Reviewed - No data to display Imaging Review No results found.  EKG Interpretation  None       MDM   1. Cough   2. Nasal congestion    Pt well appearing and in NAD. Normal VS. Lungs clear. Floanse, tessalon, nasal saline. Stable for discharge. Return precautions given. Patient states understanding of treatment care plan and is agreeable.   Illene Labrador, PA-C 03/30/13 [phone]

## 2013-03-30 NOTE — ED Provider Notes (Signed)
Medical screening examination/treatment/procedure(s) were performed by non-physician practitioner and as supervising physician I was immediately available for consultation/collaboration.  EKG Interpretation   None         Merryl Hacker, MD 03/30/13 [phone]

## 2013-03-30 NOTE — ED Provider Notes (Signed)
CSN: [id number]     Arrival date & time 03/30/13  2297 History   First MD Initiated Contact with Patient 03/30/13 [phone]     Chief Complaint  Patient presents with  . Cough    HPI Patient reports ongoing coughing congestion of the past 2 weeks.  She states despite over-the-counter cough medicine nothing seems to be helping.  She states this is affecting her sleep and she is tired and not mail to sleep.  She denies fevers or chills.  No shortness of breath.  Nothing worsens or improves her symptoms.  Chest discomfort only when she coughs.  No abdominal complaints.   Past Medical History  Diagnosis Date  . Shingles   . Migraines   . Asthma    Past Surgical History  Procedure Laterality Date  . Arm surgery     History reviewed. No pertinent family history. History  Substance Use Topics  . Smoking status: Never Smoker   . Smokeless tobacco: Not on file  . Alcohol Use: No   OB History   Grav Para Term Preterm Abortions TAB SAB Ect Mult Living                 Review of Systems  All other systems reviewed and are negative.    Allergies  Review of patient's allergies indicates no known allergies.  Home Medications   Current Outpatient Rx  Name  Route  Sig  Dispense  Refill  . Pseudoeph-Doxylamine-DM-APAP (NYQUIL PO)   Oral   Take 30 mLs by mouth at bedtime as needed (cough and cold).         . Pseudoephedrine-APAP-DM (DAYQUIL PO)   Oral   Take 30 mLs by mouth every 4 (four) hours as needed (cough and cold symptoms).         Marland Kitchen albuterol (PROVENTIL HFA;VENTOLIN HFA) 108 (90 BASE) MCG/ACT inhaler   Inhalation   Inhale 2 puffs into the lungs every 4 (four) hours as needed for wheezing or shortness of breath.   1 Inhaler   0   . diphenhydrAMINE (BENADRYL) 25 MG tablet   Oral   Take 1 tablet (25 mg total) by mouth every 6 (six) hours.   20 tablet   0    BP 142/91  Pulse 87  Temp(Src) 98.7 F (37.1 C) (Oral)  Resp 16  SpO2 100%  LMP 03/18/2013 Physical Exam   Nursing note and vitals reviewed. Constitutional: She is oriented to person, place, and time. She appears well-developed and well-nourished. No distress.  HENT:  Head: Normocephalic and atraumatic.  Eyes: EOM are normal.  Neck: Normal range of motion.  Cardiovascular: Normal rate, regular rhythm and normal heart sounds.   Pulmonary/Chest: Effort normal and breath sounds normal.  Abdominal: Soft. She exhibits no distension. There is no tenderness.  Musculoskeletal: Normal range of motion.  Neurological: She is alert and oriented to person, place, and time.  Skin: Skin is warm and dry.  Psychiatric: She has a normal mood and affect. Judgment normal.    ED Course  Procedures (including critical care time) Labs Review Labs Reviewed - No data to display Imaging Review Dg Chest 2 View  03/30/2013   CLINICAL DATA:  Productive cough for 2 weeks.  Nonsmoker.  EXAM: CHEST  2 VIEW  COMPARISON:  None.  FINDINGS: Minimal pectus excavatum deformity. Midline trachea. Normal heart size and mediastinal contours. No pleural effusion or pneumothorax. Clear lungs.  IMPRESSION: No acute cardiopulmonary disease.   Electronically Signed  By: Abigail Miyamoto M.D.   On: 03/30/2013 10:04  I personally reviewed the imaging tests through PACS system I reviewed available ER/hospitalization records through the EMR    EKG Interpretation   None       MDM   1. Cough    Likely persistent cough from upper respiratory tract infection.  Patient with minimal improvement with albuterol which was attempted as I suspect there is some degree of proximal spasm.  Patient be discharged home.  Outpatient resources given. PERC negative    Hoy Morn, MD 03/30/13 1115

## 2013-03-30 NOTE — Discharge Instructions (Signed)

## 2013-03-30 NOTE — Progress Notes (Signed)
[facility] provided pt with a [employer], list of primary care resources, and [facility] information.

## 2013-03-30 NOTE — Discharge Instructions (Signed)
Use nasal spray as directed, use tessalon for cough as directed. Nasal saline and humidifiers may help your symptoms.   Cough, Adult  A cough is a reflex that helps clear your throat and airways. It can help heal the body or may be a reaction to an irritated airway. A cough may only last 2 or 3 weeks (acute) or may last more than 8 weeks (chronic).  CAUSES Acute cough:  Viral or bacterial infections. Chronic cough:  Infections.  Allergies.  Asthma.  Post-nasal drip.  Smoking.  Heartburn or acid reflux.  Some medicines.  Chronic lung problems (COPD).  Cancer. SYMPTOMS   Cough.  Fever.  Chest pain.  Increased breathing rate.  High-pitched whistling sound when breathing (wheezing).  Colored mucus that you cough up (sputum). TREATMENT   A bacterial cough may be treated with antibiotic medicine.  A viral cough must run its course and will not respond to antibiotics.  Your caregiver may recommend other treatments if you have a chronic cough. HOME CARE INSTRUCTIONS   Only take over-the-counter or prescription medicines for pain, discomfort, or fever as directed by your caregiver. Use cough suppressants only as directed by your caregiver.  Use a cold steam vaporizer or humidifier in your bedroom or home to help loosen secretions.  Sleep in a semi-upright position if your cough is worse at night.  Rest as needed.  Stop smoking if you smoke. SEEK IMMEDIATE MEDICAL CARE IF:   You have pus in your sputum.  Your cough starts to worsen.  You cannot control your cough with suppressants and are losing sleep.  You begin coughing up blood.  You have difficulty breathing.  You develop pain which is getting worse or is uncontrolled with medicine.  You have a fever. MAKE SURE YOU:   Understand these instructions.  Will watch your condition.  Will get help right away if you are not doing well or get worse. Document Released: 08/30/2010 Document Revised:  05/26/2011 Document Reviewed: 08/30/2010 [facility] Patient Information 2014 [place].  Upper Respiratory Infection, Adult An upper respiratory infection (URI) is also sometimes known as the common cold. The upper respiratory tract includes the nose, sinuses, throat, trachea, and bronchi. Bronchi are the airways leading to the lungs. Most people improve within 1 week, but symptoms can last up to 2 weeks. A residual cough may last even longer.  CAUSES Many different viruses can infect the tissues lining the upper respiratory tract. The tissues become irritated and inflamed and often become very moist. Mucus production is also common. A cold is contagious. You can easily spread the virus to others by oral contact. This includes kissing, sharing a glass, coughing, or sneezing. Touching your mouth or nose and then touching a surface, which is then touched by another person, can also spread the virus. SYMPTOMS  Symptoms typically develop 1 to 3 days after you come in contact with a cold virus. Symptoms vary from person to person. They may include:  Runny nose.  Sneezing.  Nasal congestion.  Sinus irritation.  Sore throat.  Loss of voice (laryngitis).  Cough.  Fatigue.  Muscle aches.  Loss of appetite.  Headache.  Low-grade fever. DIAGNOSIS  You might diagnose your own cold based on familiar symptoms, since most people get a cold 2 to 3 times a year. Your caregiver can confirm this based on your exam. Most importantly, your caregiver can check that your symptoms are not due to another disease such as strep throat, sinusitis, pneumonia, asthma, or  epiglottitis. Blood tests, throat tests, and X-rays are not necessary to diagnose a common cold, but they may sometimes be helpful in excluding other more serious diseases. Your caregiver will decide if any further tests are required. RISKS AND COMPLICATIONS  You may be at risk for a more severe case of the common cold if you smoke  cigarettes, have chronic heart disease (such as heart failure) or lung disease (such as asthma), or if you have a weakened immune system. The very young and very old are also at risk for more serious infections. Bacterial sinusitis, middle ear infections, and bacterial pneumonia can complicate the common cold. The common cold can worsen asthma and chronic obstructive pulmonary disease (COPD). Sometimes, these complications can require emergency medical care and may be life-threatening. PREVENTION  The best way to protect against getting a cold is to practice good hygiene. Avoid oral or hand contact with people with cold symptoms. Wash your hands often if contact occurs. There is no clear evidence that vitamin C, vitamin E, echinacea, or exercise reduces the chance of developing a cold. However, it is always recommended to get plenty of rest and practice good nutrition. TREATMENT  Treatment is directed at relieving symptoms. There is no cure. Antibiotics are not effective, because the infection is caused by a virus, not by bacteria. Treatment may include:  Increased fluid intake. Sports drinks offer valuable electrolytes, sugars, and fluids.  Breathing heated mist or steam (vaporizer or shower).  Eating chicken soup or other clear broths, and maintaining good nutrition.  Getting plenty of rest.  Using gargles or lozenges for comfort.  Controlling fevers with ibuprofen or acetaminophen as directed by your caregiver.  Increasing usage of your inhaler if you have asthma. Zinc gel and zinc lozenges, taken in the first 24 hours of the common cold, can shorten the duration and lessen the severity of symptoms. Pain medicines may help with fever, muscle aches, and throat pain. A variety of non-prescription medicines are available to treat congestion and runny nose. Your caregiver can make recommendations and may suggest nasal or lung inhalers for other symptoms.  HOME CARE INSTRUCTIONS   Only take  over-the-counter or prescription medicines for pain, discomfort, or fever as directed by your caregiver.  Use a warm mist humidifier or inhale steam from a shower to increase air moisture. This may keep secretions moist and make it easier to breathe.  Drink enough water and fluids to keep your urine clear or pale yellow.  Rest as needed.  Return to work when your temperature has returned to normal or as your caregiver advises. You may need to stay home longer to avoid infecting others. You can also use a face mask and careful hand washing to prevent spread of the virus. SEEK MEDICAL CARE IF:   After the first few days, you feel you are getting worse rather than better.  You need your caregiver's advice about medicines to control symptoms.  You develop chills, worsening shortness of breath, or brown or red sputum. These may be signs of pneumonia.  You develop yellow or brown nasal discharge or pain in the face, especially when you bend forward. These may be signs of sinusitis.  You develop a fever, swollen neck glands, pain with swallowing, or white areas in the back of your throat. These may be signs of strep throat. SEEK IMMEDIATE MEDICAL CARE IF:   You have a fever.  You develop severe or persistent headache, ear pain, sinus pain, or chest pain.  You develop wheezing, a prolonged cough, cough up blood, or have a change in your usual mucus (if you have chronic lung disease).  You develop sore muscles or a stiff neck. Document Released: 08/27/2000 Document Revised: 05/26/2011 Document Reviewed: 07/05/2010 [facility] Patient Information 2014 [place], [place].

## 2013-03-30 NOTE — ED Notes (Signed)
Per pt, cough x 2 weeks with chest discomfort.  Using home meds with no relief.  Difficulty sleeping.  Nasal congestion.

## 2013-03-30 NOTE — ED Notes (Signed)
Patient states has had a cough x 2 wks.   Patient states she has had nasal and chest congestion.  Productive cough with green sputum.   Patient states her throat hurts when she coughs.

## 2014-03-26 ENCOUNTER — Encounter (HOSPITAL_COMMUNITY): Payer: Self-pay | Admitting: *Deleted

## 2014-03-26 ENCOUNTER — Emergency Department (HOSPITAL_COMMUNITY)
Admission: EM | Admit: 2014-03-26 | Discharge: 2014-03-26 | Discharge status: Against Medical Advice | Payer: Self-pay | Attending: Emergency Medicine | Admitting: Emergency Medicine

## 2014-03-26 DIAGNOSIS — R197 Diarrhea, unspecified: Secondary | ICD-10-CM | POA: Insufficient documentation

## 2014-03-26 DIAGNOSIS — R51 Headache: Secondary | ICD-10-CM | POA: Insufficient documentation

## 2014-03-26 DIAGNOSIS — J45909 Unspecified asthma, uncomplicated: Secondary | ICD-10-CM | POA: Insufficient documentation

## 2014-03-26 DIAGNOSIS — R509 Fever, unspecified: Secondary | ICD-10-CM | POA: Insufficient documentation

## 2014-03-26 DIAGNOSIS — R112 Nausea with vomiting, unspecified: Secondary | ICD-10-CM | POA: Insufficient documentation

## 2014-03-26 LAB — COMPREHENSIVE METABOLIC PANEL
ALBUMIN: 4.3 g/dL (ref 3.5–5.2)
ALK PHOS: 73 U/L (ref 39–117)
ALT: 19 U/L (ref 0–35)
AST: 19 U/L (ref 0–37)
Anion gap: 16 — ABNORMAL HIGH (ref 5–15)
BILIRUBIN TOTAL: 0.8 mg/dL (ref 0.3–1.2)
BUN: 8 mg/dL (ref 6–23)
CALCIUM: 9.7 mg/dL (ref 8.4–10.5)
CO2: 19 mmol/L (ref 19–32)
CREATININE: 0.56 mg/dL (ref 0.50–1.10)
Chloride: 107 mEq/L (ref 96–112)
GFR calc Af Amer: >90 mL/min (ref >90)
GFR calc non Af Amer: >90 mL/min (ref >90)
GLUCOSE: 82 mg/dL (ref 70–99)
Potassium: 3.2 mmol/L — ABNORMAL LOW (ref 3.5–5.1)
SODIUM: 142 mmol/L (ref 135–145)
Total Protein: 7.5 g/dL (ref 6.0–8.3)

## 2014-03-26 LAB — CBC WITH DIFFERENTIAL/PLATELET
BASOS ABS: 0 10*3/uL (ref 0.0–0.1)
Basophils Relative: 0 % (ref 0–1)
EOS PCT: 0 % (ref 0–5)
Eosinophils Absolute: 0 10*3/uL (ref 0.0–0.7)
HEMATOCRIT: 41.6 % (ref 36.0–46.0)
HEMOGLOBIN: 14.2 g/dL (ref 12.0–15.0)
Lymphocytes Relative: 10 % — ABNORMAL LOW (ref 12–46)
Lymphs Abs: 1.1 10*3/uL (ref 0.7–4.0)
MCH: 29 pg (ref 26.0–34.0)
MCHC: 34.1 g/dL (ref 30.0–36.0)
MCV: 85.1 fL (ref 78.0–100.0)
MONO ABS: 1.1 10*3/uL — AB (ref 0.1–1.0)
MONOS PCT: 10 % (ref 3–12)
NEUTROS ABS: 8.7 10*3/uL — AB (ref 1.7–7.7)
NEUTROS PCT: 80 % — AB (ref 43–77)
Platelets: 375 10*3/uL (ref 150–400)
RBC: 4.89 MIL/uL (ref 3.87–5.11)
RDW: 12.8 % (ref 11.5–15.5)
WBC: 11 10*3/uL — AB (ref 4.0–10.5)

## 2014-03-26 MED ORDER — ONDANSETRON 4 MG PO TBDP: 4.0000 mg | ORAL_TABLET | Freq: Once | ORAL | Status: DC

## 2014-03-26 MED ORDER — ONDANSETRON 4 MG PO TBDP
8.0000 mg | ORAL_TABLET | Freq: Once | ORAL | Status: AC
  Administered 2014-03-26: 8 mg via ORAL
  Filled 2014-03-26: qty 2

## 2014-03-26 NOTE — ED Notes (Signed)
Patient reports onset of n/v/d today.  She states she feels like she has had a fever.  She has not been able to keep any fluids down.  Patient states she is now having a migraine as well.  Patient is alert.  Ambulated into triage

## 2014-06-06 ENCOUNTER — Emergency Department (HOSPITAL_COMMUNITY)
Admission: EM | Admit: 2014-06-06 | Discharge: 2014-06-06 | Disposition: A | Discharge status: Home / Self Care | Payer: Self-pay | Attending: Emergency Medicine | Admitting: Emergency Medicine

## 2014-06-06 ENCOUNTER — Encounter (HOSPITAL_COMMUNITY): Payer: Self-pay | Admitting: Emergency Medicine

## 2014-06-06 DIAGNOSIS — R509 Fever, unspecified: Secondary | ICD-10-CM | POA: Insufficient documentation

## 2014-06-06 DIAGNOSIS — Z8619 Personal history of other infectious and parasitic diseases: Secondary | ICD-10-CM | POA: Insufficient documentation

## 2014-06-06 DIAGNOSIS — R110 Nausea: Secondary | ICD-10-CM | POA: Insufficient documentation

## 2014-06-06 DIAGNOSIS — Z79899 Other long term (current) drug therapy: Secondary | ICD-10-CM | POA: Insufficient documentation

## 2014-06-06 DIAGNOSIS — J45909 Unspecified asthma, uncomplicated: Secondary | ICD-10-CM | POA: Insufficient documentation

## 2014-06-06 DIAGNOSIS — Z3202 Encounter for pregnancy test, result negative: Secondary | ICD-10-CM | POA: Insufficient documentation

## 2014-06-06 DIAGNOSIS — R1013 Epigastric pain: Secondary | ICD-10-CM

## 2014-06-06 LAB — URINE MICROSCOPIC-ADD ON

## 2014-06-06 LAB — COMPREHENSIVE METABOLIC PANEL
ALBUMIN: 3.6 g/dL (ref 3.5–5.2)
ALK PHOS: 98 U/L (ref 39–117)
ALT: 30 U/L (ref 0–35)
AST: 29 U/L (ref 0–37)
Anion gap: 8 (ref 5–15)
BILIRUBIN TOTAL: 0.5 mg/dL (ref 0.3–1.2)
BUN: 6 mg/dL (ref 6–23)
CO2: 24 mmol/L (ref 19–32)
CREATININE: 0.6 mg/dL (ref 0.50–1.10)
Calcium: 8.9 mg/dL (ref 8.4–10.5)
Chloride: 105 mmol/L (ref 96–112)
GFR calc non Af Amer: >90 mL/min (ref >90)
GLUCOSE: 91 mg/dL (ref 70–99)
POTASSIUM: 3.6 mmol/L (ref 3.5–5.1)
Sodium: 137 mmol/L (ref 135–145)
TOTAL PROTEIN: 6.6 g/dL (ref 6.0–8.3)

## 2014-06-06 LAB — CBC WITH DIFFERENTIAL/PLATELET
Basophils Absolute: 0 10*3/uL (ref 0.0–0.1)
Basophils Relative: 0 % (ref 0–1)
EOS ABS: 0 10*3/uL (ref 0.0–0.7)
EOS PCT: 0 % (ref 0–5)
HCT: 37.7 % (ref 36.0–46.0)
HEMOGLOBIN: 12.9 g/dL (ref 12.0–15.0)
LYMPHS ABS: 0.7 10*3/uL (ref 0.7–4.0)
Lymphocytes Relative: 8 % — ABNORMAL LOW (ref 12–46)
MCH: 29.4 pg (ref 26.0–34.0)
MCHC: 34.2 g/dL (ref 30.0–36.0)
MCV: 85.9 fL (ref 78.0–100.0)
Monocytes Absolute: 1.1 10*3/uL — ABNORMAL HIGH (ref 0.1–1.0)
Monocytes Relative: 12 % (ref 3–12)
NEUTROS PCT: 80 % — AB (ref 43–77)
Neutro Abs: 7.2 10*3/uL (ref 1.7–7.7)
Platelets: 335 10*3/uL (ref 150–400)
RBC: 4.39 MIL/uL (ref 3.87–5.11)
RDW: 12.6 % (ref 11.5–15.5)
WBC: 9 10*3/uL (ref 4.0–10.5)

## 2014-06-06 LAB — URINALYSIS, ROUTINE W REFLEX MICROSCOPIC
BILIRUBIN URINE: NEGATIVE
Glucose, UA: NEGATIVE mg/dL
HGB URINE DIPSTICK: NEGATIVE
KETONES UR: NEGATIVE mg/dL
Nitrite: NEGATIVE
Protein, ur: NEGATIVE mg/dL
SPECIFIC GRAVITY, URINE: 1.023 (ref 1.005–1.030)
UROBILINOGEN UA: 1 mg/dL (ref 0.0–1.0)
pH: 6 (ref 5.0–8.0)

## 2014-06-06 LAB — POC URINE PREG, ED: Preg Test, Ur: NEGATIVE

## 2014-06-06 LAB — LIPASE, BLOOD: LIPASE: 20 U/L (ref 11–59)

## 2014-06-06 MED ORDER — GI COCKTAIL ~~LOC~~
30.0000 mL | Freq: Once | ORAL | Status: AC
  Administered 2014-06-06: 30 mL via ORAL
  Filled 2014-06-06: qty 30

## 2014-06-06 MED ORDER — PROMETHAZINE HCL 25 MG PO TABS
25.0000 mg | ORAL_TABLET | Freq: Four times a day (QID) | ORAL | Status: DC | PRN
Start: 2014-06-06 — End: 2014-10-08

## 2014-06-06 MED ORDER — ONDANSETRON 4 MG PO TBDP
8.0000 mg | ORAL_TABLET | Freq: Once | ORAL | Status: AC
Start: 2014-06-06 — End: 2014-06-06
  Administered 2014-06-06: 8 mg via ORAL
  Filled 2014-06-06: qty 2

## 2014-06-06 NOTE — ED Notes (Signed)
Patient states she woke up with a fever of 100.4 and chills.

## 2014-06-06 NOTE — Discharge Instructions (Signed)
Rest. Drink plenty of fluids. Phenergan as needed for nausea. excedrin migraines for headache. Follow up with your doctor or [facility].    Abdominal Pain, Women Abdominal (stomach, pelvic, or belly) pain can be caused by many things. It is important to tell your doctor:  The location of the pain.  Does it come and go or is it present all the time?  Are there things that start the pain (eating certain foods, exercise)?  Are there other symptoms associated with the pain (fever, nausea, vomiting, diarrhea)? All of this is helpful to know when trying to find the cause of the pain. CAUSES   Stomach: virus or bacteria infection, or ulcer.  Intestine: appendicitis (inflamed appendix), regional ileitis (Crohn's disease), ulcerative colitis (inflamed colon), irritable bowel syndrome, diverticulitis (inflamed diverticulum of the colon), or cancer of the stomach or intestine.  Gallbladder disease or stones in the gallbladder.  Kidney disease, kidney stones, or infection.  Pancreas infection or cancer.  Fibromyalgia (pain disorder).  Diseases of the female organs:  Uterus: fibroid (non-cancerous) tumors or infection.  Fallopian tubes: infection or tubal pregnancy.  Ovary: cysts or tumors.  Pelvic adhesions (scar tissue).  Endometriosis (uterus lining tissue growing in the pelvis and on the pelvic organs).  Pelvic congestion syndrome (female organs filling up with blood just before the menstrual period).  Pain with the menstrual period.  Pain with ovulation (producing an egg).  Pain with an IUD (intrauterine device, birth control) in the uterus.  Cancer of the female organs.  Functional pain (pain not caused by a disease, may improve without treatment).  Psychological pain.  Depression. DIAGNOSIS  Your doctor will decide the seriousness of your pain by doing an examination.  Blood tests.  X-rays.  Ultrasound.  CT scan (computed tomography, special  type of X-ray).  MRI (magnetic resonance imaging).  Cultures, for infection.  Barium enema (dye inserted in the large intestine, to better view it with X-rays).  Colonoscopy (looking in intestine with a lighted tube).  Laparoscopy (minor surgery, looking in abdomen with a lighted tube).  Major abdominal exploratory surgery (looking in abdomen with a large incision). TREATMENT  The treatment will depend on the cause of the pain.   Many cases can be observed and treated at home.  Over-the-counter medicines recommended by your caregiver.  Prescription medicine.  Antibiotics, for infection.  Birth control pills, for painful periods or for ovulation pain.  Hormone treatment, for endometriosis.  Nerve blocking injections.  Physical therapy.  Antidepressants.  Counseling with a psychologist or psychiatrist.  Minor or major surgery. HOME CARE INSTRUCTIONS   Do not take laxatives, unless directed by your caregiver.  Take over-the-counter pain medicine only if ordered by your caregiver. Do not take aspirin because it can cause an upset stomach or bleeding.  Try a clear liquid diet (broth or water) as ordered by your caregiver. Slowly move to a bland diet, as tolerated, if the pain is related to the stomach or intestine.  Have a thermometer and take your temperature several times a day, and record it.  Bed rest and sleep, if it helps the pain.  Avoid sexual intercourse, if it causes pain.  Avoid stressful situations.  Keep your follow-up appointments and tests, as your caregiver orders.  If the pain does not go away with medicine or surgery, you may try:  Acupuncture.  Relaxation exercises (yoga, meditation).  Group therapy.  Counseling. SEEK MEDICAL CARE IF:   You notice certain foods cause stomach pain.  Your home care treatment is not helping your pain.  You need stronger pain medicine.  You want your IUD removed.  You feel faint or  lightheaded.  You develop nausea and vomiting.  You develop a rash.  You are having side effects or an allergy to your medicine. SEEK IMMEDIATE MEDICAL CARE IF:   Your pain does not go away or gets worse.  You have a fever.  Your pain is felt only in portions of the abdomen. The right side could possibly be appendicitis. The left lower portion of the abdomen could be colitis or diverticulitis.  You are passing blood in your stools (bright red or black tarry stools, with or without vomiting).  You have blood in your urine.  You develop chills, with or without a fever.  You pass out. MAKE SURE YOU:   Understand these instructions.  Will watch your condition.  Will get help right away if you are not doing well or get worse. Document Released: 12/29/2006 Document Revised: 07/18/2013 Document Reviewed: 01/18/2009 [facility] Patient Information 2015 [place], [place]. This information is not intended to replace advice given to you by your health care provider. Make sure you discuss any questions you have with your health care provider.

## 2014-06-06 NOTE — ED Provider Notes (Signed)
CSN: [id number]     Arrival date & time 06/06/14  4196 History   First MD Initiated Contact with Patient 06/06/14 0601     Chief Complaint  Patient presents with  . Fever  . Chills     (Consider location/radiation/quality/duration/timing/severity/associated sxs/prior Treatment) HPI Ernest Orr is a 34 y.o. female with hx of asthma and migraines, presents to ED with complaint of fever, chills, upper abdominal pain, nausea. Pt states her symptoms began two hours prior to coming in. Denies URI symptoms. States woke up with body aches and chills, took her temp it ws 100.4. States she called her friend who brought her to ER. Pt also states she is having epigastric abdominal pain, nausea. Denies vomiting, but states "i know it is coming." Pt denies diarrhea. Did not take any medications prior to coming in.   Past Medical History  Diagnosis Date  . Shingles   . Migraines   . Asthma    Past Surgical History  Procedure Laterality Date  . Arm surgery     No family history on file. History  Substance Use Topics  . Smoking status: Never Smoker   . Smokeless tobacco: Not on file  . Alcohol Use: No   OB History    No data available     Review of Systems  Constitutional: Positive for fever and chills.  HENT: Negative for congestion and sore throat.   Respiratory: Negative for cough, chest tightness and shortness of breath.   Cardiovascular: Negative for chest pain, palpitations and leg swelling.  Gastrointestinal: Positive for nausea and abdominal pain. Negative for vomiting and diarrhea.  Genitourinary: Negative for dysuria, flank pain and pelvic pain.  Musculoskeletal: Negative for myalgias, arthralgias, neck pain and neck stiffness.  Skin: Negative for rash.  Neurological: Negative for dizziness, weakness and headaches.  All other systems reviewed and are negative.     Allergies  Review of patient's allergies indicates no known allergies.  Home Medications   Prior to  Admission medications   Medication Sig Start Date End Date Taking? Authorizing Provider  ibuprofen (ADVIL,MOTRIN) 200 MG tablet Take 400 mg by mouth every 6 (six) hours as needed for headache.   Yes Historical Provider, MD  albuterol (PROVENTIL HFA;VENTOLIN HFA) 108 (90 BASE) MCG/ACT inhaler Inhale 2 puffs into the lungs every 4 (four) hours as needed for wheezing or shortness of breath. Patient not taking: Reported on 06/06/2014 03/30/13   Jola Schmidt, MD  diphenhydrAMINE (BENADRYL) 25 MG tablet Take 1 tablet (25 mg total) by mouth every 6 (six) hours. Patient not taking: Reported on 06/06/2014 03/30/13   Jola Schmidt, MD  Pseudoeph-Doxylamine-DM-APAP (NYQUIL PO) Take 30 mLs by mouth at bedtime as needed (cough and cold).    Historical Provider, MD  Pseudoephedrine-APAP-DM (DAYQUIL PO) Take 30 mLs by mouth every 4 (four) hours as needed (cough and cold symptoms).    Historical Provider, MD   BP 111/64 mmHg  Pulse 84  Temp(Src) 99 F (37.2 C) (Oral)  Resp 22  Ht 5' (1.524 m)  Wt 140 lb (63.504 kg)  BMI 27.34 kg/m2  SpO2 100%  LMP 05/03/2014 Physical Exam  Constitutional: She appears well-developed and well-nourished. No distress.  HENT:  Head: Normocephalic and atraumatic.  Right Ear: External ear normal.  Left Ear: External ear normal.  Nose: Nose normal.  Mouth/Throat: Oropharynx is clear and moist. No oropharyngeal exudate.  Eyes: Conjunctivae and EOM are normal. Pupils are equal, round, and reactive to light.  Neck: Neck supple.  Cardiovascular:  Normal rate, regular rhythm and normal heart sounds.   Pulmonary/Chest: Effort normal and breath sounds normal. No respiratory distress. She has no wheezes. She has no rales.  Abdominal: Soft. Bowel sounds are normal. She exhibits no distension. There is tenderness. There is no rebound.  Epigastric tenderness  Musculoskeletal: She exhibits no edema.  Neurological: She is alert.  Skin: Skin is warm and dry.  Psychiatric: She has a normal  mood and affect. Her behavior is normal.  Nursing note and vitals reviewed.   ED Course  Procedures (including critical care time) Labs Review Labs Reviewed  CBC WITH DIFFERENTIAL/PLATELET  COMPREHENSIVE METABOLIC PANEL  LIPASE, BLOOD  URINALYSIS, ROUTINE W REFLEX MICROSCOPIC  POC URINE PREG, ED    Imaging Review No results found.   EKG Interpretation None      MDM   Final diagnoses:  Epigastric pain    Pt with low grade fever at home, here afebrile. Complaining of epigastric pain, nausea. Will check labs, ua. zofran and gi cocktail ordered.    7:50 AM Labs unremarkable, UA contaminated but she denies any urinary symptoms, doubt UTI. Patient is feeling better after Zofran and GI cocktail. He is afebrile here. Normal vital signs. Abdomen is benign. Stable for discharge home. Prescription for Phenergan. Question early gastroenteritis. Instructed to return or follow-up with her doctor if symptoms are worsening.  Filed Vitals:   06/06/14 0600 06/06/14 0630 06/06/14 0645 06/06/14 0723  BP: 111/64 123/75 120/69 99/68  Pulse: 84 81 76 85  Temp:    98.7 F (37.1 C)  TempSrc:    Oral  Resp:      Height:      Weight:      SpO2: 100% 100% 100% 100%     Jeannett Senior, PA-C 06/06/14 0751  Julianne Rice, MD 06/07/14 [phone]

## 2014-07-19 ENCOUNTER — Encounter (HOSPITAL_COMMUNITY): Payer: Self-pay | Admitting: Nurse Practitioner

## 2014-07-19 ENCOUNTER — Emergency Department (HOSPITAL_COMMUNITY)
Admission: EM | Admit: 2014-07-19 | Discharge: 2014-07-19 | Disposition: A | Discharge status: Home / Self Care | Payer: Self-pay | Attending: Emergency Medicine | Admitting: Emergency Medicine

## 2014-07-19 ENCOUNTER — Emergency Department (HOSPITAL_COMMUNITY): Payer: Self-pay

## 2014-07-19 DIAGNOSIS — G43809 Other migraine, not intractable, without status migrainosus: Secondary | ICD-10-CM | POA: Insufficient documentation

## 2014-07-19 DIAGNOSIS — Z79899 Other long term (current) drug therapy: Secondary | ICD-10-CM | POA: Insufficient documentation

## 2014-07-19 DIAGNOSIS — Z8619 Personal history of other infectious and parasitic diseases: Secondary | ICD-10-CM | POA: Insufficient documentation

## 2014-07-19 DIAGNOSIS — J45901 Unspecified asthma with (acute) exacerbation: Secondary | ICD-10-CM | POA: Insufficient documentation

## 2014-07-19 DIAGNOSIS — R079 Chest pain, unspecified: Secondary | ICD-10-CM | POA: Insufficient documentation

## 2014-07-19 DIAGNOSIS — R05 Cough: Secondary | ICD-10-CM

## 2014-07-19 DIAGNOSIS — R059 Cough, unspecified: Secondary | ICD-10-CM

## 2014-07-19 LAB — CBC
HCT: 40.9 % (ref 36.0–46.0)
Hemoglobin: 14 g/dL (ref 12.0–15.0)
MCH: 29.7 pg (ref 26.0–34.0)
MCHC: 34.2 g/dL (ref 30.0–36.0)
MCV: 86.7 fL (ref 78.0–100.0)
PLATELETS: 363 10*3/uL (ref 150–400)
RBC: 4.72 MIL/uL (ref 3.87–5.11)
RDW: 12.7 % (ref 11.5–15.5)
WBC: 5.7 10*3/uL (ref 4.0–10.5)

## 2014-07-19 LAB — BASIC METABOLIC PANEL
ANION GAP: 8 (ref 5–15)
BUN: 6 mg/dL (ref 6–20)
CHLORIDE: 104 mmol/L (ref 101–111)
CO2: 26 mmol/L (ref 22–32)
Calcium: 9.2 mg/dL (ref 8.9–10.3)
Creatinine, Ser: 0.65 mg/dL (ref 0.44–1.00)
Glucose, Bld: 84 mg/dL (ref 70–99)
Potassium: 3.6 mmol/L (ref 3.5–5.1)
SODIUM: 138 mmol/L (ref 135–145)

## 2014-07-19 MED ORDER — METOCLOPRAMIDE HCL 10 MG PO TABS: 10.0000 mg | ORAL_TABLET | Freq: Four times a day (QID) | ORAL | Status: DC | PRN

## 2014-07-19 MED ORDER — ALBUTEROL SULFATE (2.5 MG/3ML) 0.083% IN NEBU
2.5000 mg | INHALATION_SOLUTION | Freq: Once | RESPIRATORY_TRACT | Status: AC
  Administered 2014-07-19: 2.5 mg via RESPIRATORY_TRACT
  Filled 2014-07-19: qty 3

## 2014-07-19 MED ORDER — ACETAMINOPHEN 500 MG PO TABS
1000.0000 mg | ORAL_TABLET | Freq: Once | ORAL | Status: AC
  Administered 2014-07-19: 1000 mg via ORAL
  Filled 2014-07-19: qty 2

## 2014-07-19 MED ORDER — KETOROLAC TROMETHAMINE 30 MG/ML IJ SOLN
30.0000 mg | Freq: Once | INTRAMUSCULAR | Status: AC
  Administered 2014-07-19: 30 mg via INTRAVENOUS
  Filled 2014-07-19: qty 1

## 2014-07-19 MED ORDER — IPRATROPIUM-ALBUTEROL 0.5-2.5 (3) MG/3ML IN SOLN
3.0000 mL | Freq: Once | RESPIRATORY_TRACT | Status: AC
  Administered 2014-07-19: 3 mL via RESPIRATORY_TRACT
  Filled 2014-07-19: qty 3

## 2014-07-19 MED ORDER — ONDANSETRON 4 MG PO TBDP
8.0000 mg | ORAL_TABLET | Freq: Once | ORAL | Status: AC
  Administered 2014-07-19: 8 mg via ORAL
  Filled 2014-07-19: qty 2

## 2014-07-19 MED ORDER — ALBUTEROL SULFATE HFA 108 (90 BASE) MCG/ACT IN AERS
2.0000 | INHALATION_SPRAY | RESPIRATORY_TRACT | Status: AC
  Administered 2014-07-19: 2 via RESPIRATORY_TRACT
  Filled 2014-07-19: qty 6.7

## 2014-07-19 NOTE — Discharge Instructions (Signed)
°Emergency Department Resource Guide °1) Find a Doctor and Pay Out of Pocket °Although you won't have to find out who is covered by your insurance plan, it is a good idea to ask around and get recommendations. You will then need to call the office and see if the doctor you have chosen will accept you as a new patient and what types of options they offer for patients who are self-pay. Some doctors offer discounts or will set up payment plans for their patients who do not have insurance, but you will need to ask so you aren't surprised when you get to your appointment. ° °2) Contact Your Local Health Department °Not all health departments have doctors that can see patients for sick visits, but many do, so it is worth a call to see if yours does. If you don't know where your local health department is, you can check in your phone book. The CDC also has a tool to help you locate your state's health department, and many state websites also have listings of all of their local health departments. ° °3) Find a Walk-in Clinic °If your illness is not likely to be very severe or complicated, you may want to try a walk in clinic. These are popping up all over the country in pharmacies, drugstores, and shopping centers. They're usually staffed by nurse practitioners or physician assistants that have been trained to treat common illnesses and complaints. They're usually fairly quick and inexpensive. However, if you have serious medical issues or chronic medical problems, these are probably not your best option. ° °No Primary Care Doctor: °- Call [facility] at  832-8000 - they can help you locate a primary care doctor that  accepts your insurance, provides certain services, etc. °- Physician Referral Service- 1-800-533-3463 ° °Chronic Pain Problems: °Organization         Address  Phone   Notes  °[facility]  (336) 297-2271 Patients need to be referred by their primary care doctor.  ° °Medication  Assistance: °Organization         Address  Phone   Notes  °[place] [facility] [address] °[place], [place] [address] (336) 641-8030 --Must be a resident of [place] °-- Must have NO insurance coverage whatsoever (no Medicaid/ Medicare, etc.) °-- The pt. MUST have a primary care doctor that directs their care regularly and follows them in the community °  °[facility]  (866) 331-1348   °[employer]  (888) 892-1162   ° °Agencies that provide inexpensive medical care: °Organization         Address  Phone   Notes  °[facility]  (336) 832-8035   °[facility]    (336) 832-7272   °[facility] [address] °[place], [place] [address] (336) 832-4777   °[facility] [address], °[place] (336) 271-4999   °[facility]    (336) 373-0678   °[facility]    (336) 272-1050   °[facility] ° [address], [place] Phone:  (336) 832-4444, Fax:  (336) 832-4440 Hours of Operation:  9 am - 6 pm, M-F.  Also accepts Medicaid/Medicare and self-pay.  °[facility] ° [address], [place] Phone: (336) 832-3150, Fax: (336) [phone]. Hours of Operation:  8:30 am - 5:30 pm, M-F.  Also accepts Medicaid and self-pay.  °[facility] [place] [address], [place] Phone: (336) 878-6027   °[facility] [address], [place], [place] (336)723-1848, Ext. 123 Mondays & Thursdays: 7-9 AM.  First 15 patients are seen on a first come, first serve basis. °  ° °Medicaid-accepting [place] Providers: ° °Organization         Address  Phone   Notes  °[facility] [address], [place] (336) 641-2100 Also accepts self-pay patients.  °[facility] [address], [place] ° (336) 856-9996   °[facility] [address], [place]  (336) 288-8857   °[facility] [address] [place] [address], [place] (336) 299-7000   °Veita Bland [address], [place]  ° (336) 373-1557 Only accepts  Access Medicaid patients after they have their name applied to their card.  ° °Self-Pay (no insurance) in [place]: ° °Organization         Address  Phone   Notes  °Sickle Cell Patients, [facility] [address], [place] (336) 832-1970   °[facility] [address], [place] (336) 832-4400   °[facility] ° [address], [place] (336) 992-4800   °[facility]/Dr. Osei-Bonsu ° [address] [place] [address], [place] or [address], [place] (336) [phone] Phone number for both [place] and [place] locations is the same.  °[facility] [address], [place] (336) 299-0000   °[facility] [address] [place] [address], [place] or [address] (336) 852-7530 °(336) 878-2260   °[facility] [address], [place] (336) 350-1642, phone; (336) 294-5005, fax Sees patients 1st and 3rd Saturday of every month.  Must not qualify for public or private insurance (i.e. Medicaid, Medicare, [place] Health Choice, Veterans' Benefits) • Household income should be no more than 200% of the poverty level •The clinic cannot treat you if you are pregnant or think you are pregnant • Sexually transmitted diseases are not treated at the clinic.  ° ° °Dental Care: °Organization         Address  Phone  Notes  °[place] [facility] [address], [place] (336) 641-6152 Accepts children up to age 21 who are enrolled in Medicaid or [place] Health Choice; pregnant women with a Medicaid card; and children who have applied for Medicaid or [place] Health Choice, but were declined, whose parents can pay a reduced fee at time of service.  °[place]  [facility] [place]  [address], [place] (336) 641-7733 Accepts children up to age 21 who are enrolled in Medicaid or [place] Health Choice; pregnant women with a Medicaid card; and children who have applied for Medicaid or [place] Health Choice, but were declined, whose parents can pay a reduced fee at time of service.  °[facility] ° [address], [place] (336) 641-4533 Patients are seen by appointment only. Walk-ins are not accepted. [facility] will see patients 18 years of age and older. °Monday - Tuesday (8am-5pm) °Most Wednesdays (8:30-5pm) °$30 per visit, cash only  °[facility] ° [address], [place] (336) 641-4533 Patients are seen by appointment only. Walk-ins are not accepted. [facility] will see patients 18 years of age and older. °One   Wednesday Evening (Monthly: Volunteer Based).  $30 per visit, cash only  °[facility]  (919) [phone] for adults; Children under age 4, call Graduate Pediatric Dentistry at ([phone]) [phone]. Children aged 4-14, please call (919) [phone] to request a pediatric application. ° Dental services are provided in all areas of dental care including fillings, crowns and bridges, complete and partial dentures, implants, gum treatment, root canals, and extractions. Preventive care is also provided. Treatment is provided to both adults and children. °Patients are selected via a lottery and there is often a waiting list. °  °[facility] [address], °[place] ° (336) 763-8833 [email or web address] °  °[facility] [address], [place], [place] (336)723-1848, Ext. 123 Second and Fourth Thursday of each month, opens at 6:30 AM; Clinic ends at 9 AM.  Patients are seen on a first-come first-served basis, and a limited number are seen during each clinic.  ° °[facility] ° [address], [place], [place] (336) [phone]    Eligibility Requirements °You must have lived in [place], [place], or [place] counties for at least the last three months. °  You cannot be eligible for state or federal sponsored healthcare insurance, including Veterans Administration, Medicaid, or Medicare. °  You generally cannot be eligible for healthcare insurance through your employer.  °  How to apply: °Eligibility screenings are held every Tuesday and Wednesday afternoon from 1:00 pm until 4:00 pm. You do not need an appointment for the interview!  °[facility] [address], [place], [place] 336-631-2330   °[facility]  336-342-8273   °[facility]  336-703-3100   °[facility]  336-570-6415   ° °Behavioral Health Resources in the Community: °Intensive Outpatient Programs °Organization         Address  Phone  Notes  °[facility] [address], [place], [place] 336-878-6098   °[facility] [address], [place], [place] 336-832-9800   °[facility] [address], [place], [place] ° 336-882-2125   °[facility] [address],  °[place], [place] 1-800-853-5163 or 336-641-4981   °Substance Abuse Resources °Organization         Address  Phone  Notes  °[facility]  336-882-2125   °[facility]  336-784-9470   °[facility]  336-285-9073   °[facility]  336-845-3988   °Residential & Outpatient Substance Abuse Program  1-800-659-3381   °Psychological Services °Organization         Address  Phone  Notes  ° Health  336- 832-9600   °[facility]  336- 378-7881   °[facility] [address], [place] 1-800-853-5163 or 336-641-4981   ° °Mobile Crisis Teams °Organization         Address  Phone  Notes  °[facility], Mobile Crisis Care Unit  1-877-626-1772   °Assertive °[facility] ° [address]  [place], [place] 336-834-9664   °Sharon DeEsch [address] °[place] 336-554-5454   ° °Self-Help/Support Groups °Organization         Address  Phone             Notes  °[facility] [place] - variety of support groups  336- 373-1402 Call for more information  °Narcotics Anonymous (NA), [facility] [address], °[place]  2 meetings at this location  ° °  Residential Treatment Programs Organization         Address  Phone  Notes  [facility] [address]    [place]  1-636-055-8717   [facility]  [address], [place] [address], [place], [place]   [place], [place] [phone] Admissions: 8am-3pm M-F  [facility] [place] [address],    [place], [place] [phone]   [facility] [address] [place], [place], [place]   [facility] [address]  [place], [place]   [facility] Intensive Outpatient [address], [place], [place]   [facility] ([place]  [place], [place] 1-678-458-4525 or 6015736852   [facility] [address] [place], [place] Accepts Medicaid  [facility] [address] [place] [address]  [place] 1-(218)200-0629 Substance Abuse/Addiction Treatment   [facility]         Address  Phone  Notes  [facility]  252-215-1991   Domenic Schwab, PhD [address] [place], [place]   (613)359-6377 or 805-235-5087   [facility] [place], [place] (201)583-1489   [facility] [address] [place], [place] 562-704-8827 Insurance/Medicaid/sponsorship through [facility] [address]                                    [place], [place] [phone] [place] [address], [place] [phone]) [phone]    Dr. Adele Schilder  ([phone]) [phone]   [facility] Dept. 1) [address], [place] 2) [place] 3)  [address], [place] [phone]  [phone]   [place] [phone] or [phone] (After Hours)      Take over the counter tylenol and ibuprofen (OR excedrin) and benadryl, as directed on packaging, with the prescription given to you today, as needed for headache.  Keep a headache diary, as discussed.  Use your albuterol inhaler (2 to 4 puffs) every 4 hours for the next 7 days, then as needed for cough, wheezing, or shortness of breath.  Call your regular medical doctor this morning to schedule a follow up appointment within the next 3 days.  Return to the Emergency Department immediately sooner if worsening.

## 2014-07-19 NOTE — ED Provider Notes (Signed)
CSN: [id number]     Arrival date & time 07/19/14  0908 History   First MD Initiated Contact with Patient 07/19/14 [phone]     Chief Complaint  Patient presents with  . Chest Pain  . Cough  . Migraine      HPI  Pt was seen at 0930.  Per pt, c/o gradual onset and worsening of persistent cough and SOB for the past 2 days. Has been associated with CP on inspiration. Pt endorses hx of asthma, but ran out of MDI "years ago."  Denies palpitations, no back pain, no abd pain, no N/V/D, no fevers, no rash.  Pt also c/o gradual onset and persistence of constant acute flair of her chronic migraine headache for the past 2 days.  Describes the headache as per her usual chronic migraine headache pain pattern for many years. Pt took one tylenol without relief. Denies headache was sudden or maximal in onset or at any time.  Denies visual changes, no focal motor weakness, no tingling/numbness in extremities, no fevers, no neck pain, no rash, no calf/LE pain or unilateral swelling.        Past Medical History  Diagnosis Date  . Shingles   . Migraines   . Asthma    Past Surgical History  Procedure Laterality Date  . Arm surgery      History  Substance Use Topics  . Smoking status: Never Smoker   . Smokeless tobacco: Not on file  . Alcohol Use: No    Review of Systems ROS: Statement: All systems negative except as marked or noted in the HPI; Constitutional: Negative for fever and chills. ; ; Eyes: Negative for eye pain, redness and discharge. ; ; ENMT: Negative for ear pain, hoarseness, nasal congestion, sinus pressure and sore throat. ; ; Cardiovascular: Negative for palpitations, diaphoresis, and peripheral edema. ; ; Respiratory: +cough, pleuritic CP, SOB. Negative for wheezing and stridor. ; ; Gastrointestinal: Negative for nausea, vomiting, diarrhea, abdominal pain, blood in stool, hematemesis, jaundice and rectal bleeding. . ; ; Genitourinary: Negative for dysuria, flank pain and hematuria. ; ;  Musculoskeletal: Negative for back pain and neck pain. Negative for swelling and trauma.; ; Skin: Negative for pruritus, rash, abrasions, blisters, bruising and skin lesion.; ; Neuro: +headache. Negative for lightheadedness and neck stiffness. Negative for weakness, altered level of consciousness , altered mental status, extremity weakness, paresthesias, involuntary movement, seizure and syncope.      Allergies  Review of patient's allergies indicates no known allergies.  Home Medications   Prior to Admission medications   Medication Sig Start Date End Date Taking? Authorizing Provider  albuterol (PROVENTIL HFA;VENTOLIN HFA) 108 (90 BASE) MCG/ACT inhaler Inhale 2 puffs into the lungs every 4 (four) hours as needed for wheezing or shortness of breath. Patient not taking: Reported on 06/06/2014 03/30/13   Jola Schmidt, MD  diphenhydrAMINE (BENADRYL) 25 MG tablet Take 1 tablet (25 mg total) by mouth every 6 (six) hours. Patient not taking: Reported on 06/06/2014 03/30/13   Jola Schmidt, MD  ibuprofen (ADVIL,MOTRIN) 200 MG tablet Take 400 mg by mouth every 6 (six) hours as needed for headache.    Historical Provider, MD  promethazine (PHENERGAN) 25 MG tablet Take 1 tablet (25 mg total) by mouth every 6 (six) hours as needed for nausea or vomiting. 06/06/14   Tatyana Kirichenko, PA-C  Pseudoeph-Doxylamine-DM-APAP (NYQUIL PO) Take 30 mLs by mouth at bedtime as needed (cough and cold).    Historical Provider, MD  Pseudoephedrine-APAP-DM (DAYQUIL PO) Take  30 mLs by mouth every 4 (four) hours as needed (cough and cold symptoms).    Historical Provider, MD   BP 113/83 mmHg  Pulse 71  Temp(Src) 98 F (36.7 C) (Oral)  Resp 15  Ht 5' (1.524 m)  Wt 150 lb (68.04 kg)  BMI 29.30 kg/m2  SpO2 100%  LMP 07/05/2014 Physical Exam  0935: Physical examination:  Nursing notes reviewed; Vital signs and O2 SAT reviewed;  Constitutional: Well developed, Well nourished, Well hydrated, In no acute distress; Head:   Normocephalic, atraumatic; Eyes: EOMI, PERRL, No scleral icterus; ENMT: +edemetous nasal turbinates bilat with clear rhinorrhea. Mouth and pharynx without lesions. No tonsillar exudates. No intra-oral edema. No submandibular or sublingual edema. No hoarse voice, no drooling, no stridor. No trismus. Mouth and pharynx normal, Mucous membranes moist; Neck: Supple, Full range of motion, No lymphadenopathy; Cardiovascular: Regular rate and rhythm, No murmur, rub, or gallop; Respiratory: Breath sounds clear & equal bilaterally, faint scattered wheeze. No audible wheezing. Speaking full sentences with ease, Normal respiratory effort/excursion; Chest: Nontender, Movement normal; Abdomen: Soft, Nontender, Nondistended, Normal bowel sounds; Genitourinary: No CVA tenderness; Extremities: Pulses normal, No tenderness, No edema, No calf edema or asymmetry.; Neuro: AA&Ox3, Major CN grossly intact.  Speech clear. No gross focal motor or sensory deficits in extremities.; Skin: Color normal, Warm, Dry.   ED Course  Procedures     EKG Interpretation   Date/Time:  Wednesday Jul 19 2014 09:15:19 EDT Ventricular Rate:  79 PR Interval:  160 QRS Duration: 68 QT Interval:  390 QTC Calculation: 447 R Axis:   64 Text Interpretation:  Normal sinus rhythm Normal ECG No old tracing to  compare Confirmed by [facility]  MD, Nunzio Cory [phone]) on 07/19/2014 10:05:38  AM      MDM  MDM Reviewed: previous chart, nursing note and vitals Reviewed previous: ECG Interpretation: ECG, x-ray and labs     Results for orders placed or performed during the hospital encounter of 34/74/25  Basic metabolic panel  Result Value Ref Range   Sodium 138 135 - 145 mmol/L   Potassium 3.6 3.5 - 5.1 mmol/L   Chloride 104 101 - 111 mmol/L   CO2 26 22 - 32 mmol/L   Glucose, Bld 84 70 - 99 mg/dL   BUN 6 6 - 20 mg/dL   Creatinine, Ser 0.65 0.44 - 1.00 mg/dL   Calcium 9.2 8.9 - 10.3 mg/dL   GFR calc non Af Amer >60 >60 mL/min   GFR calc Af  Amer >60 >60 mL/min   Anion gap 8 5 - 15  CBC  Result Value Ref Range   WBC 5.7 4.0 - 10.5 K/uL   RBC 4.72 3.87 - 5.11 MIL/uL   Hemoglobin 14.0 12.0 - 15.0 g/dL   HCT 40.9 36.0 - 46.0 %   MCV 86.7 78.0 - 100.0 fL   MCH 29.7 26.0 - 34.0 pg   MCHC 34.2 30.0 - 36.0 g/dL   RDW 12.7 11.5 - 15.5 %   Platelets 363 150 - 400 K/uL   Dg Chest 2 View 07/19/2014   CLINICAL DATA:  Chest pain  EXAM: CHEST  2 VIEW  COMPARISON:  03/30/2013  FINDINGS: The heart size and mediastinal contours are within normal limits. Both lungs are clear. The visualized skeletal structures are unremarkable.  IMPRESSION: No active cardiopulmonary disease.   Electronically Signed   By: Franchot Gallo M.D.   On: 07/19/2014 09:58    1050:  I-stat trop 0.00 (did not cross over into  EPIC). Pt states she "feels better now" after neb and MDI and would like to go home. Lungs CTA bilat, Sats 100% R/A, resps easy, speaking in full sentences, NAD. Endorses partial improvement of migraine headache after meds; pt is driving herself, so no meds were given that would cause AMS/lethargy. Will rx reglan; rationale explained to pt, who verb understanding. Workup reassuring. Doubt PE as cause for symptoms with low risk [place].  Doubt ACS as cause for symptoms with normal troponin and unchanged EKG from previous after 2 days of constant symptoms.  Will continue to tx symptomatically at this time. Pt has asked for a work note for the next few days. Dx and testing d/w pt.  Questions answered.  Verb understanding, agreeable to d/c home with outpt f/u.   Francine Graven, DO 07/23/14 1505

## 2014-07-19 NOTE — ED Notes (Signed)
Pt endorses central, non-radiating chest pain that is aggravated by inspiration for 2 days. Pain has been relatively constant over the past two days but has eased up intermittently. Chest pain is characterized as a pressure and is accompanied by intermittent shortness of breath. Patient has hx of asthma and ran out of inhaler years ago. Patient denies dizziness, lightheadedness, nausea, or weakness. Patient endorses mild cough that started after the chest pain. Patient does not smoke.   Patient took tylenol for migraine when she was having chest pain without relief of chest pain.

## 2014-07-19 NOTE — ED Notes (Signed)
Pt is in stable condition upon d/c and ambulates from ED. 

## 2014-10-08 ENCOUNTER — Emergency Department (HOSPITAL_COMMUNITY): Payer: Self-pay

## 2014-10-08 ENCOUNTER — Encounter (HOSPITAL_COMMUNITY): Payer: Self-pay | Admitting: *Deleted

## 2014-10-08 ENCOUNTER — Emergency Department (HOSPITAL_COMMUNITY)
Admission: EM | Admit: 2014-10-08 | Discharge: 2014-10-09 | Disposition: A | Discharge status: Home / Self Care | Payer: Self-pay | Attending: Emergency Medicine | Admitting: Emergency Medicine

## 2014-10-08 DIAGNOSIS — Z8619 Personal history of other infectious and parasitic diseases: Secondary | ICD-10-CM | POA: Insufficient documentation

## 2014-10-08 DIAGNOSIS — R102 Pelvic and perineal pain: Secondary | ICD-10-CM

## 2014-10-08 DIAGNOSIS — D259 Leiomyoma of uterus, unspecified: Secondary | ICD-10-CM | POA: Insufficient documentation

## 2014-10-08 DIAGNOSIS — G43909 Migraine, unspecified, not intractable, without status migrainosus: Secondary | ICD-10-CM | POA: Insufficient documentation

## 2014-10-08 DIAGNOSIS — J45909 Unspecified asthma, uncomplicated: Secondary | ICD-10-CM | POA: Insufficient documentation

## 2014-10-08 DIAGNOSIS — Z3202 Encounter for pregnancy test, result negative: Secondary | ICD-10-CM | POA: Insufficient documentation

## 2014-10-08 LAB — CBC
HEMATOCRIT: 41.2 % (ref 36.0–46.0)
Hemoglobin: 14.2 g/dL (ref 12.0–15.0)
MCH: 30 pg (ref 26.0–34.0)
MCHC: 34.5 g/dL (ref 30.0–36.0)
MCV: 86.9 fL (ref 78.0–100.0)
PLATELETS: 336 10*3/uL (ref 150–400)
RBC: 4.74 MIL/uL (ref 3.87–5.11)
RDW: 12.6 % (ref 11.5–15.5)
WBC: 6 10*3/uL (ref 4.0–10.5)

## 2014-10-08 LAB — URINALYSIS, ROUTINE W REFLEX MICROSCOPIC
Bilirubin Urine: NEGATIVE
Glucose, UA: NEGATIVE mg/dL
Hgb urine dipstick: NEGATIVE
KETONES UR: NEGATIVE mg/dL
LEUKOCYTES UA: NEGATIVE
Nitrite: NEGATIVE
PROTEIN: NEGATIVE mg/dL
Specific Gravity, Urine: 1.019 (ref 1.005–1.030)
Urobilinogen, UA: 1 mg/dL (ref 0.0–1.0)
pH: 7 (ref 5.0–8.0)

## 2014-10-08 LAB — COMPREHENSIVE METABOLIC PANEL
ALT: 14 U/L (ref 14–54)
ANION GAP: 10 (ref 5–15)
AST: 17 U/L (ref 15–41)
Albumin: 3.9 g/dL (ref 3.5–5.0)
Alkaline Phosphatase: 70 U/L (ref 38–126)
BUN: 6 mg/dL (ref 6–20)
CO2: 22 mmol/L (ref 22–32)
CREATININE: 0.63 mg/dL (ref 0.44–1.00)
Calcium: 9.4 mg/dL (ref 8.9–10.3)
Chloride: 106 mmol/L (ref 101–111)
GFR calc Af Amer: >60 mL/min (ref >60)
Glucose, Bld: 123 mg/dL — ABNORMAL HIGH (ref 65–99)
Potassium: 3.4 mmol/L — ABNORMAL LOW (ref 3.5–5.1)
Sodium: 138 mmol/L (ref 135–145)
Total Bilirubin: 0.5 mg/dL (ref 0.3–1.2)
Total Protein: 7.3 g/dL (ref 6.5–8.1)

## 2014-10-08 LAB — WET PREP, GENITAL
Trich, Wet Prep: NONE SEEN
Yeast Wet Prep HPF POC: NONE SEEN

## 2014-10-08 LAB — LIPASE, BLOOD: Lipase: 23 U/L (ref 22–51)

## 2014-10-08 LAB — POC URINE PREG, ED: PREG TEST UR: NEGATIVE

## 2014-10-08 MED ORDER — HYDROCODONE-ACETAMINOPHEN 5-325 MG PO TABS
1.0000 | ORAL_TABLET | Freq: Once | ORAL | Status: AC
  Administered 2014-10-08: 1 via ORAL
  Filled 2014-10-08: qty 1

## 2014-10-08 MED ORDER — PROMETHAZINE HCL 25 MG PO TABS: 25.0000 mg | ORAL_TABLET | Freq: Four times a day (QID) | ORAL | Status: DC | PRN

## 2014-10-08 MED ORDER — HYDROCODONE-ACETAMINOPHEN 5-325 MG PO TABS
1.0000 | ORAL_TABLET | Freq: Once | ORAL | Status: AC
  Administered 2014-10-09: 1 via ORAL
  Filled 2014-10-08: qty 1

## 2014-10-08 NOTE — Discharge Instructions (Signed)
You have been evaluated for your abdominal pain.  There's evidence of uterine fibroid.  Take tylenol or ibuprofen as needed for pain control.  Take phenergan as needed for nausea.  If your pain worsen please return to the ER for further evaluation, you may need a CT then.     Uterine Fibroid A uterine fibroid is a growth (tumor) in your uterus. It is not cancerous. Some fibroids cause pain or heavy bleeding during and in between periods. [place] care depends on how you were treated. In general:  Keep all doctor visits.  Only take medicine as told by your doctor. Do not take aspirin.  Talk to your doctor about taking iron pills.  If you have painful periods that are not heavy, lie down with your feet up. Put cold packs on your belly (abdomen).  If you have heavy periods, tell your doctor how many pads or tampons you use in a month.  Eat green vegetables. GET HELP RIGHT AWAY IF:   You have lower belly (pelvic) pain or cramps not helped by medicine.  You have sudden lower belly pain that gets worse.  You have more bleeding between and during your periods.  If you soak your tampon or pad in 30 minutes or less.  You feel lightheaded or faint. MAKE SURE YOU:   Understand these instructions.  Will watch your condition.  Will get help right away if you are not doing well or get worse. Document Released: 05/30/2008 Document Revised: 12/22/2012 Document Reviewed: 09/30/2012 [facility] Patient Information 2015 [place], [place]. This information is not intended to replace advice given to you by your health care provider. Make sure you discuss any questions you have with your health care provider.   Emergency Department Resource Guide 1) Find a Doctor and Pay Out of Pocket Although you won't have to find out who is covered by your insurance plan, it is a good idea to ask around and get recommendations. You will then need to call the office and see if the doctor you have chosen will  accept you as a new patient and what types of options they offer for patients who are self-pay. Some doctors offer discounts or will set up payment plans for their patients who do not have insurance, but you will need to ask so you aren't surprised when you get to your appointment.  2) Contact Your Local Health Department Not all health departments have doctors that can see patients for sick visits, but many do, so it is worth a call to see if yours does. If you don't know where your local health department is, you can check in your phone book. The CDC also has a tool to help you locate your state's health department, and many state websites also have listings of all of their local health departments.  3) Find a [place] [facility] If your illness is not likely to be very severe or complicated, you may want to try a walk in clinic. These are popping up all over the country in pharmacies, drugstores, and shopping centers. They're usually staffed by nurse practitioners or physician assistants that have been trained to treat common illnesses and complaints. They're usually fairly quick and inexpensive. However, if you have serious medical issues or chronic medical problems, these are probably not your best option.  No Primary Care Doctor: - Call Health Connect at  [phone] - they can help you locate a primary care doctor that  accepts your insurance, provides certain services,  etc. - Physician Referral Service- [phone]  Chronic Pain Problems: Organization         Address  Phone   Notes  [facility]  [phone] Patients need to be referred by their primary care doctor.   Medication Assistance: Organization         Address  Phone   Notes  [facility] Medication [facility] [address] [place], [place] [address] [phone] --Must be a resident of [facility] -- Must have NO insurance coverage whatsoever (no Medicaid/ Medicare, etc.) -- The pt.  MUST have a primary care doctor that directs their care regularly and follows them in the community   MedAssist  [phone]   [employer]  [phone]    Agencies that provide inexpensive medical care: Organization         Address  Phone   Notes  [place]  [phone]   [facility]    [phone]   [facility] [place], [place] [address] [phone]   [place] [address] [place] [phone]   Planned Parenthood    [phone]   [facility]    [phone]   [place] and [place] [address], [place] Phone:  [phone], Fax:  [phone] Hours of Operation:  9 am - 6 pm, M-F.  Also accepts Medicaid/Medicare and self-pay.  [facility] for [place], [address], [place] Phone: [phone], Fax: [phone]. Hours of Operation:  8:30 am - 5:30 pm, M-F.  Also accepts Medicaid and self-pay.  [facility] [place] [address] [place] Phone: [phone]   [place], [place], [place] [phone], Ext. [phone] Mondays & Thursdays: 7-9 AM.  First 15 patients are seen on a first come, first serve basis.    [place] Providers:  Organization         Address  Phone   Notes  [facility] [address], [place] [phone] Also accepts self-pay patients.  [facility] [address], [place]  [phone]   [place], [address], [place] [phone]   [facility] Family Medicine [address], [place] [phone]   Lucianne Lei [address], [place]   [phone] Only accepts [place] Access [place] patients after they have their name applied to their card.   Self-Pay (no insurance) in  [facility]:  Organization         Address  Phone   Notes  Sickle Cell Patients, [facility] Internal Medicine [place] [phone]   [facility] Urgent Care [place] [phone]   [facility] [place]  [place], [place], [place] [phone]   [facility]/Dr. Osei-Bonsu  [address], [place] or [address], [place] [phone] Phone number for both [place] and [place] locations is the same.  Urgent [facility] [address] [place] [phone]   [facility] [address] [place] or [address] [phone]   [facility] [place], [place] (  [phone], phone; [phone], fax Sees patients 1st and 3rd Saturday of every month.  Must not qualify for public or private insurance (i.e. Medicaid, Medicare, [place] Health Choice, Veterans' Benefits)  Household income should be no more than 200% of the poverty level The clinic cannot treat you if you are pregnant or think you are pregnant  Sexually transmitted diseases are not treated at the clinic.    Dental Care: Organization         Address  Phone  Notes  [facility] Department of [facility] [place] [phone] Accepts children up to age 62 who are enrolled in [place] or [place]; pregnant women with a Medicaid card; and children who have applied for Medicaid or [place] Health Choice, but were declined, whose parents can pay a reduced fee at time of service.  [facility] Department of [facility]  [address], [place] [phone] Accepts children up to age 41 who are enrolled in [place] or [place]; pregnant women with a Medicaid card; and children who have applied for Medicaid or [place] Health Choice, but were declined, whose  parents can pay a reduced fee at time of service.  [place] Adult Dental Access PROGRAM  [place] [phone] Patients are seen by appointment only. Walk-ins are not accepted. [place] will see patients 71 years of age and older. Monday - Tuesday (8am-5pm) Most Wednesdays (8:30-5pm) $30 per visit, cash only  [facility] Adult Dental Access PROGRAM  [address], [facility] [phone] Patients are seen by appointment only. Walk-ins are not accepted. [place] will see patients 81 years of age and older. One Wednesday Evening (Monthly: Volunteer Based).  $30 per visit, cash only  [place]  [phone] for adults; Children under age 8, call Graduate Pediatric Dentistry at [phone]. Children aged 62-14, please call [phone] to request a pediatric application.  Dental services are provided in all areas of dental care including fillings, crowns and bridges, complete and partial dentures, implants, gum treatment, root canals, and extractions. Preventive care is also provided. Treatment is provided to both adults and children. Patients are selected via a lottery and there is often a waiting list.   [facility] [address], [place]  [phone] [email or web address]   [facility] [address] [place], [place] [phone], Ext. [phone] Second and Fourth Thursday of each month, opens at 6:30 AM; Clinic ends at 9 AM.  Patients are seen on a first-come first-served basis, and a limited number are seen during each clinic.   [facility]  [address] Hillard Danker [place], [place] [phone]   Eligibility Requirements You must have lived in [place], [place], or [place] counties for at least the last three months.   You cannot be eligible for state or federal sponsored [employer], including [employer], [place], or [employer].   You generally cannot be eligible for  healthcare insurance through your employer.    How to apply: Eligibility screenings are held every Tuesday and Wednesday afternoon from 1:00 pm until 4:00 pm. You do not need an appointment for the interview!  [facility] [address] [place], [place]   [place]  [place]  [place]  [phone]    Behavioral Health  Resources in the Community: Intensive Outpatient Programs Organization         Address  Phone  Notes  [facility] [place]. [address], [place], [place] [phone]   [facility] Outpatient [address] [place], [place]   ADS: Alcohol & Drug Svcs [address] [place], [place]   [place] [address]  [place], [place] or [phone]   Substance Abuse Resources Organization         Address  Phone  Notes  Alcohol and Drug Services  [phone]   [place]  [phone]   [facility] [place]   Chinita Pester  7194442441   Residential & Outpatient Substance Abuse Program  [phone]   Psychological Services Organization         Address  Phone  Notes  [facility] [place]  [place]  [phone]   [place] [address], [place] or [phone]    Mobile Crisis Teams Organization         Address  Phone  Notes  [facility], Mobile Crisis Care Unit  [phone]   [facility]  [address] [place], [place]   [place] Levels [address] [place] [phone]    Self-Help/Support Groups Organization         Address  Phone             Notes  [place]. of [place] - variety of support groups  [place] Call for more information  Narcotics  Anonymous (NA), [facility] [address], [employer] [place]  2 meetings at this location   Special educational needs teacher         Address  Phone  Notes  [facility] Residential Treatment [place],    [place]  [phone]   [facility]  [address] [place] [address], [place], [place]   [place], [place] [phone] Admissions: 8am-3pm M-F  Incentives Substance [place] [address],    [place], [place] [phone]   [facility] [address] [place], [place], [place]   [facility] [address],  [place], [place]   Insight Programs - Intensive Outpatient [address] Kristeen [address], [place], [place]   [facility] [place].,  [place], [place] [phone] or [phone]   [facility] Services (RTS) [address] [place], [place] Accepts Medicaid  [place] [address]  [place] [phone] Substance Abuse/Addiction Treatment   [facility] Organization         Address  Phone  Notes  [facility]  9700506175   Domenic Schwab, PhD [address] Arlis Porta [place], [place]   [phone] or [phone]   [facility]eth[place] Newell [place], [place] [phone]   [place] [address] [place], [place] [phone] Insurance/Medicaid/sponsorship through [employer] and Families [address] [place]                                    [place], [place] [phone] [place] [address]   [place], [place] [phone])  [phone]    Dr. Adele Schilder  [phone]   [facility] Dept. 1) [address], [place] 2) [place] 3)  [place] [address], [place] [phone]  [phone]   [place] [phone] or [phone] (After Hours)

## 2014-10-08 NOTE — ED Notes (Signed)
The pt is c/o abd pain for 2 hours. With no nv or diarrhea.  She alsoi wants to be seen for lt wrist pain that she has had for 2 months.  lmp  May 23rd

## 2014-10-08 NOTE — ED Notes (Signed)
Pt transported to u/s.  

## 2014-10-08 NOTE — ED Provider Notes (Signed)
CSN: [id number]     Arrival date & time 10/08/14  2022 History   First MD Initiated Contact with Patient 10/08/14 2050     Chief Complaint  Patient presents with  . Abdominal Pain     (Consider location/radiation/quality/duration/timing/severity/associated sxs/prior Treatment) HPI   34 year old female with history of migraine and asthma, shingle presents for valuation of abdominal pain. Patient report of approximately 8 hours ago she was sitting a call when she experiencing acute onset of low abdominal pain. She described pain as a sharp, stabbing pain, nonradiating, worsening with movement. She endorse nausea without vomiting. She subsequently reported having sexual intercourse with her boyfriend shortly afterward but the pain became more intense and she has to stop. Pain is now persistent, mildly improved with resting. Specific treatment tried. Denies having similar symptoms like this in the past. Last menstrual period was June 23. She is a P0 G0. She denies having fever, chills, back pain, dysuria, hematuria, vaginal bleeding, vaginal discharge. Patient has normal appetite, no postprandial pain. Does endorse mild shortness of breath but has history of asthma. She also has carpal tunnel syndrome in left wrist and endorse mild left wrist pain.     Past Medical History  Diagnosis Date  . Shingles   . Migraines   . Asthma    Past Surgical History  Procedure Laterality Date  . Arm surgery     No family history on file. History  Substance Use Topics  . Smoking status: Never Smoker   . Smokeless tobacco: Not on file  . Alcohol Use: No   OB History    No data available     Review of Systems  All other systems reviewed and are negative.     Allergies  Review of patient's allergies indicates no known allergies.  Home Medications   Prior to Admission medications   Medication Sig Start Date End Date Taking? Authorizing Provider  albuterol (PROVENTIL HFA;VENTOLIN HFA) 108 (90  BASE) MCG/ACT inhaler Inhale 2 puffs into the lungs every 4 (four) hours as needed for wheezing or shortness of breath. Patient not taking: Reported on 06/06/2014 03/30/13   Jola Schmidt, MD  diphenhydrAMINE (BENADRYL) 25 MG tablet Take 1 tablet (25 mg total) by mouth every 6 (six) hours. Patient not taking: Reported on 06/06/2014 03/30/13   Jola Schmidt, MD  ibuprofen (ADVIL,MOTRIN) 200 MG tablet Take 400 mg by mouth every 6 (six) hours as needed for headache.    Historical Provider, MD  metoCLOPramide (REGLAN) 10 MG tablet Take 1 tablet (10 mg total) by mouth every 6 (six) hours as needed for nausea (for headache or nausea). 07/19/14   Francine Graven, DO  promethazine (PHENERGAN) 25 MG tablet Take 1 tablet (25 mg total) by mouth every 6 (six) hours as needed for nausea or vomiting. 06/06/14   Tatyana Kirichenko, PA-C  Pseudoeph-Doxylamine-DM-APAP (NYQUIL PO) Take 30 mLs by mouth at bedtime as needed (cough and cold).    Historical Provider, MD  Pseudoephedrine-APAP-DM (DAYQUIL PO) Take 30 mLs by mouth every 4 (four) hours as needed (cough and cold symptoms).    Historical Provider, MD   BP 135/82 mmHg  Pulse 70  Temp(Src) 97.5 F (36.4 C)  Resp 18  Ht 5\' 1"  (1.549 m)  Wt 159 lb 3 oz (72.207 kg)  BMI 30.09 kg/m2  SpO2 98%  LMP 08/07/2014 Physical Exam  Constitutional: She appears well-developed and well-nourished. No distress.  HENT:  Head: Atraumatic.  Eyes: Conjunctivae are normal.  Neck: Neck supple.  Cardiovascular: Normal rate and regular rhythm.   Pulmonary/Chest: Effort normal and breath sounds normal.  Abdominal: Soft. Bowel sounds are normal. She exhibits no distension. There is tenderness (Tenderness to suprapubic and right lower quadrant on palpation with mild guarding but no rebound tenderness.).  No CVA tenderness.  Genitourinary:  Chaperone present during exam.  No inguinal lymphadenopathy or inguinal hernia noted. Normal external genitalia. Mild discomfort with speculum  insertion. Normal vaginal vault with mild functional discharge. Closed cervical os without bleeding or friability. On bimanual examination bilateral adnexal tenderness and cervical motion tenderness.  Neurological: She is alert.  Skin: No rash noted.  Psychiatric: She has a normal mood and affect.  Nursing note and vitals reviewed.   ED Course  Procedures (including critical care time)  Patient here with low abdominal pain, worsening with sexual activities. Workup initiated, pain medication given, will determine if patient needs abdominal ultrasound for further care.  11:40 PM Patient does have tenderness on pelvic examination. A wet prep shows no significant signs of infection. Her labs are reassuring, no leukocytosis, normal UA, normal electrolytes. A transvaginal ultrasound performed demonstrating evidence of uterine fibroid but normal sonographic appearance of both ovaries without torsion. Patient's pain has remained unchanged. She denies any loss of appetite, vomiting, fever to suggest appendicitis. No obvious cause identified during this visit. I offer patient a CT scan for further evaluation however patient prefers watchful waiting and serial abd exam. She understands to return if her pain worsened which she may need CT scan for further evaluation. Otherwise resources provided for outpatient follow-up.  Labs Review Labs Reviewed  WET PREP, GENITAL - Abnormal; Notable for the following:    Clue Cells Wet Prep HPF POC FEW (*)    WBC, Wet Prep HPF POC FEW (*)    All other components within normal limits  COMPREHENSIVE METABOLIC PANEL - Abnormal; Notable for the following:    Potassium 3.4 (*)    Glucose, Bld 123 (*)    All other components within normal limits  URINALYSIS, ROUTINE W REFLEX MICROSCOPIC (NOT AT [facility]) - Abnormal; Notable for the following:    APPearance CLOUDY (*)    All other components within normal limits  LIPASE, BLOOD  CBC  RPR  HIV ANTIBODY (ROUTINE TESTING)   POC URINE PREG, ED  GC/CHLAMYDIA PROBE AMP ([place]) NOT AT [facility]    Imaging Review US Transvaginal Non-ob  10/08/2014   CLINICAL DATA:  Pelvic pain for 2 hours. Rule out torsion, tubo-ovarian abscess or ovarian cyst.  EXAM: TRANSABDOMINAL AND TRANSVAGINAL ULTRASOUND OF PELVIS  DOPPLER ULTRASOUND OF OVARIES  TECHNIQUE: Both transabdominal and transvaginal ultrasound examinations of the pelvis were performed. Transabdominal technique was performed for global imaging of the pelvis including uterus, ovaries, adnexal regions, and pelvic cul-de-sac.  It was necessary to proceed with endovaginal exam following the transabdominal exam to visualize the left and right ovary. Color and duplex Doppler ultrasound was utilized to evaluate blood flow to the ovaries.  COMPARISON:  None.  FINDINGS: Uterus  Measurements: 10.2 x 4.4 x 5.7 cm. There is a pedunculated fibroid in the anterior lower uterine segment measuring at least 3.8 x 4.0 x 3.0 cm on transvaginal imaging. Small nabothian cysts in the cervix.  Endometrium  Thickness: 2.7 mm.  No focal abnormality visualized.  Right ovary  Measurements: 2.4 x 2.5 x 2.9 cm. Normal appearance/no adnexal mass. There is normal blood flow.  Left ovary  Measurements: 2.5 x 1.9 x 2.5 cm. Normal appearance/no adnexal mass. There  is normal blood flow.  Pulsed Doppler evaluation of both ovaries demonstrates normal low-resistance arterial and venous waveforms.  Other findings  Trace free fluid.  IMPRESSION: 1. Normal sonographic appearance of both ovaries without torsion. 2. Pedunculated uterine fibroid measuring up to 4 cm arising from the anterior lower uterine segment.   Electronically Signed   By: Jeb Levering M.D.   On: 10/08/2014 23:27   US Pelvis Complete  10/08/2014   CLINICAL DATA:  Pelvic pain for 2 hours. Rule out torsion, tubo-ovarian abscess or ovarian cyst.  EXAM: TRANSABDOMINAL AND TRANSVAGINAL ULTRASOUND OF PELVIS  DOPPLER ULTRASOUND OF OVARIES  TECHNIQUE: Both  transabdominal and transvaginal ultrasound examinations of the pelvis were performed. Transabdominal technique was performed for global imaging of the pelvis including uterus, ovaries, adnexal regions, and pelvic cul-de-sac.  It was necessary to proceed with endovaginal exam following the transabdominal exam to visualize the left and right ovary. Color and duplex Doppler ultrasound was utilized to evaluate blood flow to the ovaries.  COMPARISON:  None.  FINDINGS: Uterus  Measurements: 10.2 x 4.4 x 5.7 cm. There is a pedunculated fibroid in the anterior lower uterine segment measuring at least 3.8 x 4.0 x 3.0 cm on transvaginal imaging. Small nabothian cysts in the cervix.  Endometrium  Thickness: 2.7 mm.  No focal abnormality visualized.  Right ovary  Measurements: 2.4 x 2.5 x 2.9 cm. Normal appearance/no adnexal mass. There is normal blood flow.  Left ovary  Measurements: 2.5 x 1.9 x 2.5 cm. Normal appearance/no adnexal mass. There is normal blood flow.  Pulsed Doppler evaluation of both ovaries demonstrates normal low-resistance arterial and venous waveforms.  Other findings  Trace free fluid.  IMPRESSION: 1. Normal sonographic appearance of both ovaries without torsion. 2. Pedunculated uterine fibroid measuring up to 4 cm arising from the anterior lower uterine segment.   Electronically Signed   By: Jeb Levering M.D.   On: 10/08/2014 23:27   [place] Art/ven Flow Abd Pelv Doppler  10/08/2014   CLINICAL DATA:  Pelvic pain for 2 hours. Rule out torsion, tubo-ovarian abscess or ovarian cyst.  EXAM: TRANSABDOMINAL AND TRANSVAGINAL ULTRASOUND OF PELVIS  DOPPLER ULTRASOUND OF OVARIES  TECHNIQUE: Both transabdominal and transvaginal ultrasound examinations of the pelvis were performed. Transabdominal technique was performed for global imaging of the pelvis including uterus, ovaries, adnexal regions, and pelvic cul-de-sac.  It was necessary to proceed with endovaginal exam following the transabdominal exam to visualize  the left and right ovary. Color and duplex Doppler ultrasound was utilized to evaluate blood flow to the ovaries.  COMPARISON:  None.  FINDINGS: Uterus  Measurements: 10.2 x 4.4 x 5.7 cm. There is a pedunculated fibroid in the anterior lower uterine segment measuring at least 3.8 x 4.0 x 3.0 cm on transvaginal imaging. Small nabothian cysts in the cervix.  Endometrium  Thickness: 2.7 mm.  No focal abnormality visualized.  Right ovary  Measurements: 2.4 x 2.5 x 2.9 cm. Normal appearance/no adnexal mass. There is normal blood flow.  Left ovary  Measurements: 2.5 x 1.9 x 2.5 cm. Normal appearance/no adnexal mass. There is normal blood flow.  Pulsed Doppler evaluation of both ovaries demonstrates normal low-resistance arterial and venous waveforms.  Other findings  Trace free fluid.  IMPRESSION: 1. Normal sonographic appearance of both ovaries without torsion. 2. Pedunculated uterine fibroid measuring up to 4 cm arising from the anterior lower uterine segment.   Electronically Signed   By: Jeb Levering M.D.   On: 10/08/2014 23:27  EKG Interpretation None      MDM   Final diagnoses:  Pelvic pain in female  Uterine leiomyoma, unspecified location    BP 133/81 mmHg  Pulse 66  Temp(Src) 97.5 F (36.4 C)  Resp 18  Ht 5\' 1"  (1.549 m)  Wt 159 lb 3 oz (72.207 kg)  BMI 30.09 kg/m2  SpO2 100%  LMP 08/07/2014  I have reviewed nursing notes and vital signs. I personally viewed the imaging tests through PACS system and agrees with radiologist's intepretation I reviewed available ER/hospitalization records through the EMR     Domenic Moras, PA-C 10/08/14 [place], MD 10/11/14 [phone]

## 2014-10-09 LAB — HIV ANTIBODY (ROUTINE TESTING W REFLEX): HIV Screen 4th Generation wRfx: NONREACTIVE

## 2014-10-09 LAB — GC/CHLAMYDIA PROBE AMP (~~LOC~~) NOT AT ARMC
Chlamydia: NEGATIVE
Neisseria Gonorrhea: NEGATIVE

## 2014-10-09 LAB — RPR: RPR: NONREACTIVE

## 2014-10-09 NOTE — ED Notes (Signed)
Assumed care of pt for discharge

## 2014-10-23 ENCOUNTER — Emergency Department (HOSPITAL_COMMUNITY)
Admission: EM | Admit: 2014-10-23 | Discharge: 2014-10-23 | Disposition: A | Discharge status: Home / Self Care | Payer: Self-pay | Attending: Emergency Medicine | Admitting: Emergency Medicine

## 2014-10-23 ENCOUNTER — Encounter (HOSPITAL_COMMUNITY): Payer: Self-pay | Admitting: *Deleted

## 2014-10-23 DIAGNOSIS — H9203 Otalgia, bilateral: Secondary | ICD-10-CM | POA: Insufficient documentation

## 2014-10-23 DIAGNOSIS — Z8619 Personal history of other infectious and parasitic diseases: Secondary | ICD-10-CM | POA: Insufficient documentation

## 2014-10-23 DIAGNOSIS — Z79899 Other long term (current) drug therapy: Secondary | ICD-10-CM | POA: Insufficient documentation

## 2014-10-23 DIAGNOSIS — Z8679 Personal history of other diseases of the circulatory system: Secondary | ICD-10-CM | POA: Insufficient documentation

## 2014-10-23 DIAGNOSIS — J45909 Unspecified asthma, uncomplicated: Secondary | ICD-10-CM | POA: Insufficient documentation

## 2014-10-23 DIAGNOSIS — H109 Unspecified conjunctivitis: Secondary | ICD-10-CM | POA: Insufficient documentation

## 2014-10-23 MED ORDER — IBUPROFEN 800 MG PO TABS: 800.0000 mg | ORAL_TABLET | Freq: Three times a day (TID) | ORAL | Status: DC | PRN

## 2014-10-23 MED ORDER — POLYMYXIN B-TRIMETHOPRIM 10000-0.1 UNIT/ML-% OP SOLN: 1.0000 [drp] | OPHTHALMIC | Status: DC

## 2014-10-23 MED ORDER — FLUORESCEIN SODIUM 1 MG OP STRP
1.0000 | ORAL_STRIP | Freq: Once | OPHTHALMIC | Status: DC
  Filled 2014-10-23: qty 1

## 2014-10-23 MED ORDER — TETRACAINE HCL 0.5 % OP SOLN
2.0000 [drp] | Freq: Once | OPHTHALMIC | Status: AC
  Administered 2014-10-23: 2 [drp] via OPHTHALMIC
  Filled 2014-10-23: qty 2

## 2014-10-23 NOTE — ED Notes (Signed)
Pt is here with red, irritated right eye for the last 2 days and now the left eye is hurting. Pt reports drainage.  Pt took contact out of right eye.

## 2014-10-23 NOTE — Discharge Instructions (Signed)
Read the information below.  Use the prescribed medication as directed.  Please discuss all new medications with your pharmacist.  You may return to the Emergency Department at any time for worsening condition or any new symptoms that concern you.    If you develop worsening pain in your eye, change in your vision, swelling around your eye, difficulty moving your eye, or fevers greater than 100.4, see your eye doctor or return to the Emergency Department immediately for a recheck.     Conjunctivitis Conjunctivitis is commonly called "pink eye." Conjunctivitis can be caused by bacterial or viral infection, allergies, or injuries. There is usually redness of the lining of the eye, itching, discomfort, and sometimes discharge. There may be deposits of matter along the eyelids. A viral infection usually causes a watery discharge, while a bacterial infection causes a yellowish, thick discharge. Pink eye is very contagious and spreads by direct contact. You may be given antibiotic eyedrops as part of your treatment. Before using your eye medicine, remove all drainage from the eye by washing gently with warm water and cotton balls. Continue to use the medication until you have awakened 2 mornings in a row without discharge from the eye. Do not rub your eye. This increases the irritation and helps spread infection. Use separate towels from other household members. Wash your hands with soap and water before and after touching your eyes. Use cold compresses to reduce pain and sunglasses to relieve irritation from light. Do not wear contact lenses or wear eye makeup until the infection is gone. SEEK MEDICAL CARE IF:   Your symptoms are not better after 3 days of treatment.  You have increased pain or trouble seeing.  The outer eyelids become very red or swollen. Document Released: 04/10/2004 Document Revised: 05/26/2011 Document Reviewed: 03/03/2005 [facility] Patient Information 2015 [place], [place]. This  information is not intended to replace advice given to you by your health care provider. Make sure you discuss any questions you have with your health care provider.   Emergency Department Resource Guide 1) Find a Doctor and Pay Out of Pocket Although you won't have to find out who is covered by your insurance plan, it is a good idea to ask around and get recommendations. You will then need to call the office and see if the doctor you have chosen will accept you as a new patient and what types of options they offer for patients who are self-pay. Some doctors offer discounts or will set up payment plans for their patients who do not have insurance, but you will need to ask so you aren't surprised when you get to your appointment.  2) Contact Your Local Health Department Not all health departments have doctors that can see patients for sick visits, but many do, so it is worth a call to see if yours does. If you don't know where your local health department is, you can check in your phone book. The CDC also has a tool to help you locate your state's health department, and many state websites also have listings of all of their local health departments.  3) Find a [facility] If your illness is not likely to be very severe or complicated, you may want to try a walk in clinic. These are popping up all over the country in pharmacies, drugstores, and shopping centers. They're usually staffed by nurse practitioners or physician assistants that have been trained to treat common illnesses and complaints. They're usually fairly quick and inexpensive. However,  if you have serious medical issues or chronic medical problems, these are probably not your best option.  No Primary Care Doctor: - Call Health Connect at  [phone] - they can help you locate a primary care doctor that  accepts your insurance, provides certain services, etc. - Physician Referral Service- [phone]  Chronic Pain  Problems: Organization         Address  Phone   Notes  [facility]  [phone] Patients need to be referred by their primary care doctor.   Medication Assistance: Organization         Address  Phone   Notes  [facility] Medication [facility] [address] [place], [place] [address] [phone] --Must be a resident of [facility] -- Must have NO insurance coverage whatsoever (no Medicaid/ Medicare, etc.) -- The pt. MUST have a primary care doctor that directs their care regularly and follows them in the community   MedAssist  [phone]   [employer]  [phone]    Agencies that provide inexpensive medical care: Organization         Address  Phone   Notes  [place]  [phone]   [facility]    [phone]   [facility] [place], [place] [address] [phone]   [place] [address], [place] [phone]   Planned Parenthood    [phone]   [facility]    [phone]   [place] [address], [place] Phone:  [phone], Fax:  [phone] Hours of Operation:  9 am - 6 pm, M-F.  Also accepts Medicaid/Medicare and self-pay.  [facility] [place], [address], [place] Phone: [phone], Fax: [phone]. Hours of Operation:  8:30 am - 5:30 pm, M-F.  Also accepts Medicaid and self-pay.  [facility] [place] [address] [place] Phone: [phone]   [place], [place], [place] [phone], Ext. [phone] Mondays & Thursdays: 7-9 AM.  First 15 patients are seen on a first come, first serve basis.    [place] Providers:  Organization         Address  Phone   Notes  [facility] [address], [place] [phone] Also  accepts self-pay patients.  [facility] [address], [place]  [phone]   [place], [address], [place] [phone]   [facility] [address], [place] [phone]   Lucianne Lei [address], [place]   [phone] Only accepts [place] Access [place] patients after they have their name applied to their card.   Self-Pay (no insurance) in [facility]:  Organization         Address  Phone   Notes  Sickle Cell Patients, [facility] [place] [phone]   [facility] [place] [phone]   [facility] [place]  [place], [place], [place] [phone]   [facility]/Dr. Osei-Bonsu  [address] [place] or [address], [place] [phone] Phone number for both [facility]  and [place] locations is the same.  Urgent Medical and [facility] [address], [place] [phone]   [facility] [address], [place] or [address] [phone]   [facility] [address], [place] [phone], phone; [phone], fax Sees patients 1st and 3rd Saturday of every month.  Must not qualify for public or private insurance (i.e. Medicaid, Medicare, [place] Health Choice, Veterans' Benefits)  Household income should be no more than 200% of the poverty level The clinic cannot treat you if you are pregnant or think you are pregnant  Sexually transmitted diseases are not treated at the clinic.    Dental Care: Organization         Address  Phone  Notes  [facility] [place] [phone] Accepts children up to age 91 who are enrolled in [place] or [place]; pregnant  women with a Medicaid card; and children who have applied for Medicaid or [place] Health Choice, but were declined, whose parents can pay a reduced fee at time of service.  [facility] Department of [facility]  [address], [place] [phone] Accepts children up to age 17 who are enrolled in [place] or [place]; pregnant women with a Medicaid card; and children who have applied for Medicaid or [place] Health Choice, but were declined, whose parents can pay a reduced fee at time of service.  [facility] Adult [facility] Access PROGRAM  [place] [phone] Patients are seen by appointment only. Walk-ins are not accepted. [place] will see patients 13 years of age and older. Monday - Tuesday (8am-5pm) Most Wednesdays (8:30-5pm) $30 per visit, cash only  [facility] Adult [facility] Access PROGRAM  [address], [facility] [phone] Patients are seen by appointment only. Walk-ins are not accepted. [place] will see patients 62 years of age and older. One Wednesday Evening (Monthly: Volunteer Based).  $30 per visit, cash only  [place]  [phone] for adults; Children under age 42, call Graduate Pediatric Dentistry at [phone]. Children aged 72-14, please call [phone] to request a pediatric application.  Dental services are provided in all areas of dental care including fillings, crowns and bridges, complete and partial dentures, implants, gum treatment, root canals, and extractions. Preventive care is also provided. Treatment is provided to both adults and children. Patients are selected via a lottery and there is often a waiting list.   [facility] [address], [place]  [phone] [email or web address]   [facility] [address] [place], [place] [phone], Ext. [phone] Second and Fourth Thursday of each month, opens at 6:30 AM; Clinic ends at 9 AM.  Patients are  seen on a first-come first-served basis, and a limited number are seen during each clinic.   [facility]  [address] Hillard Danker [place], [place] [phone]   Eligibility Requirements You must have lived in [place], [place], or [place] counties for at least the last three months.   You cannot be eligible for state or federal sponsored [employer], including [employer], [place], or [employer].   You generally cannot be eligible for healthcare insurance through your employer.    How to apply: Eligibility screenings are held every Tuesday and Wednesday afternoon from 1:00 pm until 4:00 pm. You do  not need an appointment for the interview!  [facility] [address] [place], [place]   [place]  [place] Department  [place]  [phone]    Behavioral Health Resources in the Community: Intensive Outpatient Programs Organization         Address  Phone  Notes  [place]. [address] [place], [place] [phone]   [facility] Outpatient [address] [place], [place]   ADS: Alcohol & Drug S[facility]cs [address] [place], [place]   [place] [address],  [place], [place] or [phone]   Substance Abuse Resources Organization         Address  Phone  Notes  Alcohol and Drug Services  [phone]   [place]  [phone]   [facility] [place]   Chinita Pester  [phone]   Residential & Outpatient Substance Abuse Program  [phone]   Psychological Services Organization         Address  Phone  Notes  [facility] Jet  [place]  [phone]   [place] [address], [place] or [phone]    Mobile Crisis  Teams Organization         Address  Phone  Notes  [facility], Mobile Crisis Care Unit  [phone]   [facility]  [address] [place], [place]   [place] [address], [place] [phone]    Self-Help/Support Groups Organization         Address  Phone             Notes  [place]. of [place] - variety of support groups  [place] Call for more information  Narcotics Anonymous (NA), [facility]aring [facility] [address], [employer] [place]  2 meetings at this location   Special educational needs teacher         Address  Phone  Notes  [facility]idential [facility] [place],    [place]  [phone]   [facility]  [address], [place] [address], [place], [place]   [place], [place] [phone] Admissions: 8am-3pm M-F  Incentives Substance [place] [address],    [place], [place] [phone]   [facility] [address] [place], [place], [place]   [facility] [address]  [place], [place]   [facility] - Intensive Outpatient [address] Kristeen Mans [address], [place], [place]   [facility] ([place] [place].,  [place], [place] [phone] or [phone]   [facility]idential Treatment Services (RTS) [address] [place] [address], [place], [place] Accepts Medicaid  [facility] [place] [address],  [place] [phone] Substance Abuse/Addiction Treatment   [facility] Organization         Address  Phone  Notes  [facility]  [phone]   Domenic Schwab, PhD [address] Arlis [place], [place]   [phone] or [phone]   [place], [place] [phone]   [place] [address] [place], [place] [phone]  Insurance/Medicaid/sponsorship through [employer] and Families [address] [place] [address]  [place], [place] [phone] [place], [place] [phone]    Dr. Adele Schilder  [phone]   [facility] [place] Dept. 1) [address], [place] 2) [place] 3)  [place] [address], [place] [phone]  [phone]   [place] [phone] or [phone] (After Hours)

## 2014-10-23 NOTE — ED Provider Notes (Signed)
CSN: [id number]     Arrival date & time 10/23/14  2831 History   First MD Initiated Contact with Patient 10/23/14 [phone]     Chief Complaint  Patient presents with  . Eye Problem     (Consider location/radiation/quality/duration/timing/severity/associated sxs/prior Treatment) HPI   Pt presents with right eye pain, irritation, redness, discharge that began two days ago.  States she woke up with her right eye swollen and crusted over.  The pain is aching and itching.  Her left eye is starting to itch but is not red or painful yet.  She does wear contacts and has since removed this contact.  She has had nasal congestion and bilateral ear pressure.  Denies any trauma to the eye, no activities involving metal or wood working.  No splash with chemicals.  Has had no contacts with pink eye/sick contacts and has no allergies.    Past Medical History  Diagnosis Date  . Shingles   . Migraines   . Asthma    Past Surgical History  Procedure Laterality Date  . Arm surgery     No family history on file. History  Substance Use Topics  . Smoking status: Never Smoker   . Smokeless tobacco: Not on file  . Alcohol Use: No   OB History    No data available     Review of Systems  Constitutional: Negative for fever and chills.  HENT: Positive for congestion, ear pain and sinus pressure. Negative for ear discharge, facial swelling, rhinorrhea and sore throat.   Eyes: Positive for pain, discharge, redness and itching.  Respiratory: Negative for cough.   Skin: Negative for color change, rash and wound.  Allergic/Immunologic: Negative for immunocompromised state.  Psychiatric/Behavioral: Negative for self-injury.      Allergies  Review of patient's allergies indicates no known allergies.  Home Medications   Prior to Admission medications   Medication Sig Start Date End Date Taking? Authorizing Provider  albuterol (PROVENTIL HFA;VENTOLIN HFA) 108 (90 BASE) MCG/ACT inhaler Inhale 2 puffs into  the lungs every 4 (four) hours as needed for wheezing or shortness of breath. 03/30/13   Jola Schmidt, MD  promethazine (PHENERGAN) 25 MG tablet Take 1 tablet (25 mg total) by mouth every 6 (six) hours as needed for nausea. 10/08/14   Domenic Moras, PA-C   BP 125/84 mmHg  Pulse 94  Temp(Src) 97.9 F (36.6 C) (Oral)  Resp 18  SpO2 100%  LMP 10/21/2014 Physical Exam  Constitutional: She appears well-developed and well-nourished. No distress.  HENT:  Head: Normocephalic and atraumatic.  Mouth/Throat: Oropharynx is clear and moist. No oropharyngeal exudate.  Eyes: EOM and lids are normal. Pupils are equal, round, and reactive to light. Right eye exhibits no discharge. Left eye exhibits no discharge. Right conjunctiva is injected. Right conjunctiva has no hemorrhage. Left conjunctiva is not injected. Left conjunctiva has no hemorrhage. No scleral icterus.  Slit lamp exam:      The right eye shows no corneal abrasion, no corneal flare, no corneal ulcer, no foreign body, no hyphema, no hypopyon and no fluorescein uptake.  Right eye pressure 17  Neck: Neck supple.  Cardiovascular: Normal rate and regular rhythm.   Pulmonary/Chest: Effort normal and breath sounds normal. No respiratory distress. She has no wheezes. She has no rales.  Neurological: She is alert.  Skin: She is not diaphoretic.  Nursing note and vitals reviewed.   ED Course  Procedures (including critical care time) Labs Review Labs Reviewed - No data to display  Imaging Review No results found.   EKG Interpretation None      MDM   Final diagnoses:  Conjunctivitis of right eye    Afebrile, nontoxic patient with right eye redness, irritation.  She does have some mild URI symptoms, likely viral conjunctivitis.  Discussed this with patient, pt also given polytrim ophth drops to cover for possible bacterial infection.    D/C home with polytrim, ibuprofen.  Discussed result, findings, treatment, and follow up  with patient.   Pt given return precautions.  Pt verbalizes understanding and agrees with plan.         Clayton Bibles, PA-C 10/23/14 [place], MD 10/23/14 [phone]

## 2014-12-12 ENCOUNTER — Encounter (HOSPITAL_COMMUNITY): Payer: Self-pay | Admitting: Emergency Medicine

## 2014-12-12 ENCOUNTER — Emergency Department (HOSPITAL_COMMUNITY)
Admission: EM | Admit: 2014-12-12 | Discharge: 2014-12-12 | Disposition: A | Discharge status: Home / Self Care | Payer: Self-pay | Attending: Emergency Medicine | Admitting: Emergency Medicine

## 2014-12-12 DIAGNOSIS — Z79899 Other long term (current) drug therapy: Secondary | ICD-10-CM | POA: Insufficient documentation

## 2014-12-12 DIAGNOSIS — G43009 Migraine without aura, not intractable, without status migrainosus: Secondary | ICD-10-CM | POA: Insufficient documentation

## 2014-12-12 DIAGNOSIS — Z8619 Personal history of other infectious and parasitic diseases: Secondary | ICD-10-CM | POA: Insufficient documentation

## 2014-12-12 DIAGNOSIS — Z8679 Personal history of other diseases of the circulatory system: Secondary | ICD-10-CM | POA: Insufficient documentation

## 2014-12-12 DIAGNOSIS — J45909 Unspecified asthma, uncomplicated: Secondary | ICD-10-CM | POA: Insufficient documentation

## 2014-12-12 MED ORDER — DEXAMETHASONE SODIUM PHOSPHATE 4 MG/ML IJ SOLN
10.0000 mg | Freq: Once | INTRAMUSCULAR | Status: AC
  Administered 2014-12-12: 10 mg via INTRAVENOUS
  Filled 2014-12-12: qty 3

## 2014-12-12 MED ORDER — DIPHENHYDRAMINE HCL 50 MG/ML IJ SOLN
25.0000 mg | Freq: Once | INTRAMUSCULAR | Status: AC
  Administered 2014-12-12: 25 mg via INTRAVENOUS
  Filled 2014-12-12: qty 1

## 2014-12-12 MED ORDER — METOCLOPRAMIDE HCL 5 MG/ML IJ SOLN
10.0000 mg | Freq: Once | INTRAMUSCULAR | Status: AC
  Administered 2014-12-12: 10 mg via INTRAVENOUS
  Filled 2014-12-12: qty 2

## 2014-12-12 MED ORDER — SODIUM CHLORIDE 0.9 % IV SOLN
INTRAVENOUS | Status: AC
  Administered 2014-12-12: 15:00:00 via INTRAVENOUS

## 2014-12-12 NOTE — Discharge Instructions (Signed)

## 2014-12-12 NOTE — ED Notes (Signed)
Hx migraine headaches. Started 4 days ago with this episode.

## 2014-12-12 NOTE — ED Notes (Signed)
Patient ambulated to restroom without difficulty.

## 2014-12-12 NOTE — ED Provider Notes (Signed)
CSN: [id number]     Arrival date & time 12/12/14  1318 History  By signing my name below, I, Tula Nakayama, attest that this documentation has been prepared under the direction and in the presence of Debroah Baller, NP.  Electronically Signed: Tula Nakayama, ED Scribe. 12/12/2014. 2:14 PM  Chief Complaint  Patient presents with  . Headache   The history is provided by the patient. No language interpreter was used.    HPI Comments: Marcia Pham is a 34 y.o. female with a history of migraines who presents to the Emergency Department complaining of a constant, moderate HA in her bilateral temples that radiates to the front of her head and started 4 days ago. Pt states nausea and vomiting as associated symptoms. She has tried Advil with no relief. Pt has had migraines for several years and states that her current pain is consistent with prior episodes. Her last migraine was 3 weeks ago. Pt has not been seen by a neurologist for several years. She denies fever.  No PCP  Past Medical History  Diagnosis Date  . Shingles   . Migraines   . Asthma    Past Surgical History  Procedure Laterality Date  . Arm surgery     History reviewed. No pertinent family history. Social History  Substance Use Topics  . Smoking status: Never Smoker   . Smokeless tobacco: None  . Alcohol Use: No   OB History    No data available     Review of Systems  Constitutional: Negative for fever.  Gastrointestinal: Positive for nausea and vomiting.  Neurological: Positive for headaches.  All other systems reviewed and are negative.  Allergies  Review of patient's allergies indicates no known allergies.  Home Medications   Prior to Admission medications   Medication Sig Start Date End Date Taking? Authorizing Provider  albuterol (PROVENTIL HFA;VENTOLIN HFA) 108 (90 BASE) MCG/ACT inhaler Inhale 2 puffs into the lungs every 4 (four) hours as needed for wheezing or shortness of breath. 03/30/13   Jola Schmidt,  MD  ibuprofen (ADVIL,MOTRIN) 800 MG tablet Take 1 tablet (800 mg total) by mouth every 8 (eight) hours as needed for mild pain or moderate pain. 10/23/14   Clayton Bibles, PA-C  promethazine (PHENERGAN) 25 MG tablet Take 1 tablet (25 mg total) by mouth every 6 (six) hours as needed for nausea. 10/08/14   Domenic Moras, PA-C  trimethoprim-polymyxin b (POLYTRIM) ophthalmic solution Place 1 drop into the right eye every 4 (four) hours. 10/23/14   Clayton Bibles, PA-C   BP 139/88 mmHg  Pulse 76  Temp(Src) 97.6 F (36.4 C) (Oral)  Resp 16  SpO2 100%  LMP 11/11/2014 (Approximate) Physical Exam  Constitutional: She is oriented to person, place, and time. She appears well-developed and well-nourished. No distress.  HENT:  Head: Normocephalic and atraumatic.  Right Ear: Tympanic membrane and external ear normal.  Left Ear: Tympanic membrane and external ear normal.  Nose: Nose normal.  Mouth/Throat: Uvula is midline, oropharynx is clear and moist and mucous membranes are normal.  Eyes: Conjunctivae and EOM are normal. Pupils are equal, round, and reactive to light. Right eye exhibits no nystagmus. Left eye exhibits no nystagmus.  Neck: Normal range of motion. Neck supple.  Cardiovascular: Normal rate, regular rhythm and normal heart sounds.   Pulmonary/Chest: Effort normal and breath sounds normal. No respiratory distress. She has no wheezes. She has no rales.  Abdominal: Soft. Bowel sounds are normal. She exhibits no mass. There is no tenderness.  Musculoskeletal: She exhibits no edema.  Radial and pedal pulses strong, adequate circulation, good touch sensation.  Neurological: She is alert and oriented to person, place, and time. She has normal strength and normal reflexes. No cranial nerve deficit or sensory deficit. She exhibits normal muscle tone. She displays a negative Romberg sign. Coordination and gait normal.  Reflex Scores:      Bicep reflexes are 2+ on the right side and 2+ on the left side.       Brachioradialis reflexes are 2+ on the right side and 2+ on the left side.      Patellar reflexes are 2+ on the right side and 2+ on the left side.      Achilles reflexes are 2+ on the right side and 2+ on the left side. Rapid alternating movements without difficulty  Psychiatric: She has a normal mood and affect. Her behavior is normal.  Nursing note and vitals reviewed.   ED Course  Procedures   DIAGNOSTIC STUDIES: Oxygen Saturation is 99% on RA, normal by my interpretation.    COORDINATION OF CARE: 2:12 PM Discussed treatment plan with pt which includes a migraine cocktail. Pt agreed to plan.  3:36 PM Upon re-evaluation, pt is feeling better. Migraine is resolved with treatment.   MDM  34 y.o. female with headache similar to her migraines in the past. Stable for d/c with no focal neuro deficits and resolved headache after medication and IV fluids. Discussed with the patient plan of care and all questioned fully answered. She will follow up with neurology or return here if any problems arise.  Final diagnoses:  Migraine without aura and without status migrainosus, not intractable   I personally performed the services described in this documentation, which was scribed in my presence. The recorded information has been reviewed and is accurate.   [place], [place] 12/13/14 2145  Blanchie Dessert, MD 12/14/14 2159

## 2014-12-12 NOTE — ED Notes (Signed)
P with 4 days hx of migraine headache. Per patient feels like typical symptoms. Pt reports trying OTC medications with no relief. Pt reports nausea and vomiting today with the pain. PT awake, alert, oriented x4, NAD at present.

## 2015-03-07 ENCOUNTER — Encounter (HOSPITAL_COMMUNITY): Payer: Self-pay | Admitting: Emergency Medicine

## 2015-03-07 ENCOUNTER — Emergency Department (HOSPITAL_COMMUNITY)
Admission: EM | Admit: 2015-03-07 | Discharge: 2015-03-07 | Disposition: A | Discharge status: Home / Self Care | Payer: Self-pay | Attending: Emergency Medicine | Admitting: Emergency Medicine

## 2015-03-07 DIAGNOSIS — Z79899 Other long term (current) drug therapy: Secondary | ICD-10-CM | POA: Insufficient documentation

## 2015-03-07 DIAGNOSIS — R05 Cough: Secondary | ICD-10-CM

## 2015-03-07 DIAGNOSIS — J45901 Unspecified asthma with (acute) exacerbation: Secondary | ICD-10-CM | POA: Insufficient documentation

## 2015-03-07 DIAGNOSIS — Z8679 Personal history of other diseases of the circulatory system: Secondary | ICD-10-CM | POA: Insufficient documentation

## 2015-03-07 DIAGNOSIS — R059 Cough, unspecified: Secondary | ICD-10-CM

## 2015-03-07 DIAGNOSIS — Z8619 Personal history of other infectious and parasitic diseases: Secondary | ICD-10-CM | POA: Insufficient documentation

## 2015-03-07 LAB — BASIC METABOLIC PANEL
Anion gap: 6 (ref 5–15)
BUN: 8 mg/dL (ref 6–20)
CHLORIDE: 109 mmol/L (ref 101–111)
CO2: 23 mmol/L (ref 22–32)
Calcium: 8.7 mg/dL — ABNORMAL LOW (ref 8.9–10.3)
Creatinine, Ser: 0.63 mg/dL (ref 0.44–1.00)
GFR calc Af Amer: >60 mL/min (ref >60)
GFR calc non Af Amer: >60 mL/min (ref >60)
GLUCOSE: 102 mg/dL — AB (ref 65–99)
POTASSIUM: 3.7 mmol/L (ref 3.5–5.1)
Sodium: 138 mmol/L (ref 135–145)

## 2015-03-07 LAB — CBC WITH DIFFERENTIAL/PLATELET
Basophils Absolute: 0 10*3/uL (ref 0.0–0.1)
Basophils Relative: 1 %
EOS PCT: 3 %
Eosinophils Absolute: 0.2 10*3/uL (ref 0.0–0.7)
HEMATOCRIT: 37.3 % (ref 36.0–46.0)
Hemoglobin: 12.7 g/dL (ref 12.0–15.0)
LYMPHS ABS: 1.2 10*3/uL (ref 0.7–4.0)
LYMPHS PCT: 23 %
MCH: 29.9 pg (ref 26.0–34.0)
MCHC: 34 g/dL (ref 30.0–36.0)
MCV: 87.8 fL (ref 78.0–100.0)
Monocytes Absolute: 0.9 10*3/uL (ref 0.1–1.0)
Monocytes Relative: 18 %
Neutro Abs: 2.7 10*3/uL (ref 1.7–7.7)
Neutrophils Relative %: 55 %
PLATELETS: 271 10*3/uL (ref 150–400)
RBC: 4.25 MIL/uL (ref 3.87–5.11)
RDW: 12.9 % (ref 11.5–15.5)
WBC: 5 10*3/uL (ref 4.0–10.5)

## 2015-03-07 LAB — I-STAT TROPONIN, ED: Troponin i, poc: 0 ng/mL (ref 0.00–0.08)

## 2015-03-07 MED ORDER — ALBUTEROL SULFATE HFA 108 (90 BASE) MCG/ACT IN AERS: 1.0000 | INHALATION_SPRAY | Freq: Four times a day (QID) | RESPIRATORY_TRACT | Status: DC | PRN

## 2015-03-07 MED ORDER — IBUPROFEN 800 MG PO TABS
800.0000 mg | ORAL_TABLET | Freq: Once | ORAL | Status: AC
  Administered 2015-03-07: 800 mg via ORAL
  Filled 2015-03-07: qty 1

## 2015-03-07 MED ORDER — BENZONATATE 100 MG PO CAPS: 100.0000 mg | ORAL_CAPSULE | Freq: Three times a day (TID) | ORAL | Status: DC

## 2015-03-07 MED ORDER — GUAIFENESIN ER 600 MG PO TB12
600.0000 mg | ORAL_TABLET | Freq: Two times a day (BID) | ORAL | Status: DC
Start: 2015-03-07 — End: 2015-06-22

## 2015-03-07 MED ORDER — IBUPROFEN 800 MG PO TABS: 800.0000 mg | ORAL_TABLET | Freq: Three times a day (TID) | ORAL | Status: DC | PRN

## 2015-03-07 NOTE — ED Notes (Signed)
Serena PA at bedside.

## 2015-03-07 NOTE — ED Notes (Signed)
Pt presents from home with sob and productive cough x 2 days and nasal congestion; pt denies fever or CP; pt describes pain as "pressure", "someone sitting on my chest"'

## 2015-03-07 NOTE — ED Notes (Signed)
Pt verbalizes understanding of d/c instructions.

## 2015-03-07 NOTE — ED Provider Notes (Signed)
CSN: [id number]     Arrival date & time 03/07/15  [id number] History   First MD Initiated Contact with Patient 03/07/15 [phone]     Chief Complaint  Patient presents with  . Shortness of Breath  . Cough   HPI   [name] is an 34 y.o. female with history of asthma and migraines. She presents to the ED for evaluation of cough. She states she started with a cough productive of thick white sputum two days ago. She endorses associated shortness of breath whenever she has coughing fits. She states that her chest hurts all over when she coughs and it is affecting her sleep. Denies hemoptysis. Also endorses nasal congestion and rhinorrhea. She does report poor appetite though denies n/v/d. Denies fever, chills, abdominal pain, myalgias, urinary symptoms. Denies sick contacts. She has tried NyQuil and Robitussin for her symptoms with no relief. Denies h/o blood clots, recent travel, malignancy. Denies OCP use. She states she does not have an inhaler at home. She is requesting a work note.   Past Medical History  Diagnosis Date  . Shingles   . Migraines   . Asthma    Past Surgical History  Procedure Laterality Date  . Arm surgery     No family history on file. Social History  Substance Use Topics  . Smoking status: Never Smoker   . Smokeless tobacco: Not on file  . Alcohol Use: No   OB History    No data available     Review of Systems  All other systems reviewed and are negative.     Allergies  Review of patient's allergies indicates no known allergies.  Home Medications   Prior to Admission medications   Medication Sig Start Date End Date Taking? Authorizing Provider  albuterol (PROVENTIL HFA;VENTOLIN HFA) 108 (90 BASE) MCG/ACT inhaler Inhale 2 puffs into the lungs every 4 (four) hours as needed for wheezing or shortness of breath. 03/30/13   Jola Schmidt, MD  ibuprofen (ADVIL,MOTRIN) 800 MG tablet Take 1 tablet (800 mg total) by mouth every 8 (eight) hours as needed for mild pain or  moderate pain. 10/23/14   Clayton Bibles, PA-C  promethazine (PHENERGAN) 25 MG tablet Take 1 tablet (25 mg total) by mouth every 6 (six) hours as needed for nausea. 10/08/14   Domenic Moras, PA-C  trimethoprim-polymyxin b (POLYTRIM) ophthalmic solution Place 1 drop into the right eye every 4 (four) hours. 10/23/14   Clayton Bibles, PA-C   BP 125/81 mmHg  Pulse 80  Temp(Src) 98 F (36.7 C) (Oral)  Resp 12  SpO2 98% Physical Exam  Constitutional: She is oriented to person, place, and time. No distress.  HENT:  Right Ear: External ear normal.  Left Ear: External ear normal.  Nose: Mucosal edema and rhinorrhea present.  Mouth/Throat: Oropharynx is clear and moist and mucous membranes are normal. No oropharyngeal exudate.  Eyes: Conjunctivae and EOM are normal. Pupils are equal, round, and reactive to light.  Neck: Normal range of motion. Neck supple.  Cardiovascular: Normal rate, regular rhythm, normal heart sounds and intact distal pulses.   Pulmonary/Chest: Effort normal and breath sounds normal. No accessory muscle usage. No respiratory distress. She has no wheezes. She exhibits no tenderness.  Abdominal: Soft. Bowel sounds are normal. She exhibits no distension. There is no tenderness. There is no CVA tenderness.  Musculoskeletal: Normal range of motion. She exhibits no edema or tenderness.  Lymphadenopathy:    She has cervical adenopathy.  Neurological: She is alert and oriented  to person, place, and time. No cranial nerve deficit.  Skin: Skin is warm and dry. No rash noted. She is not diaphoretic. No pallor.  Psychiatric: She has a normal mood and affect.  Nursing note and vitals reviewed.   ED Course  Procedures (including critical care time) Labs Review Labs Reviewed  BASIC METABOLIC PANEL - Abnormal; Notable for the following:    Glucose, Bld 102 (*)    Calcium 8.7 (*)    All other components within normal limits  CBC WITH DIFFERENTIAL/PLATELET  I-STAT TROPOININ, ED    Imaging  Review No results found. I have personally reviewed and evaluated these images and lab results as part of my medical decision-making.   EKG Interpretation   Date/Time:  Wednesday March 07 2015 06:39:56 EST Ventricular Rate:  70 PR Interval:  163 QRS Duration: 75 QT Interval:  391 QTC Calculation: 422 R Axis:   38 Text Interpretation:  Sinus rhythm Confirmed by [facility]  MD, APRIL  ([id number]) on 03/07/2015 6:44:24 AM      MDM   Final diagnoses:  Cough    Pt is an 34 y.o. female with h/o asthma who presents with 2 days of cough, congestion, and SOB. She appears congested otherwise is very well-appearing on my exam. Her lung sounds are unremarkable with no increased WOB, wheezes, rhonchi, or rales. Her chest hurts diffusely with cough but denies chest pain at rest, diaphoresis, weakness, syncope. I suspect she has musculoskeletal pain 2/2 coughing fits. I have low suspicion for ACS, PE, pneumothorax, other emergent cardiopulmonary pathology. PERC and [place] 0. She is afebrile, not tachycardic, normotensive, and maintains SpO2 98-100% on room air. She speaks comfortably in complete sentences. I suspect viral URI. Will get CXR to r/o pneumonia or other acute pathology. Will check CBC, BMP, EKG, i stat trop. Anticipate d/c home with supportive meds including mucinex, tessalon, and albuterol with PCP f/u.  Pt declines CXR on re-eval. She states she does not want exposure to the radiation. HEART score 0. Trop negative, EKG NSR and otherwise unremarkable. VSS. Labs unremarkable. Will d/c home with meds as above. Resource guide given to establish PCP.   Anne Ng, PA-C 03/07/15 0800  April Palumbo, MD 03/07/15 [phone]

## 2015-03-07 NOTE — Discharge Instructions (Signed)
You were seen in the emergency room today for a cough. Your labs were normal. We did not get a chest x-ray. Your symptoms are likely due to a virus. I will give you several medications to help with your symptoms.   Take medications as prescribed. Return to the emergency room for worsening condition or new concerning symptoms. Follow up with your regular doctor. If you don't have a regular doctor use one of the numbers below to establish a primary care doctor.   Emergency Department Resource Guide 1) Find a Doctor and Pay Out of Pocket Although you won't have to find out who is covered by your insurance plan, it is a good idea to ask around and get recommendations. You will then need to call the office and see if the doctor you have chosen will accept you as a new patient and what types of options they offer for patients who are self-pay. Some doctors offer discounts or will set up payment plans for their patients who do not have insurance, but you will need to ask so you aren't surprised when you get to your appointment.  2) Contact Your Local Health Department Not all health departments have doctors that can see patients for sick visits, but many do, so it is worth a call to see if yours does. If you don't know where your local health department is, you can check in your phone book. The CDC also has a tool to help you locate your state's health department, and many state websites also have listings of all of their local health departments.  3) Find a [facility] If your illness is not likely to be very severe or complicated, you may want to try a walk in clinic. These are popping up all over the country in pharmacies, drugstores, and shopping centers. They're usually staffed by nurse practitioners or physician assistants that have been trained to treat common illnesses and complaints. They're usually fairly quick and inexpensive. However, if you have serious medical issues or chronic medical  problems, these are probably not your best option.  No Primary Care Doctor: - Call Health Connect at  [phone] - they can help you locate a primary care doctor that  accepts your insurance, provides certain services, etc. - Physician Referral [phone]  Emergency Department Resource Guide 1) Find a Doctor and Pay Out of Pocket Although you won't have to find out who is covered by your insurance plan, it is a good idea to ask around and get recommendations. You will then need to call the office and see if the doctor you have chosen will accept you as a new patient and what types of options they offer for patients who are self-pay. Some doctors offer discounts or will set up payment plans for their patients who do not have insurance, but you will need to ask so you aren't surprised when you get to your appointment.  2) Contact Your Local Health Department Not all health departments have doctors that can see patients for sick visits, but many do, so it is worth a call to see if yours does. If you don't know where your local health department is, you can check in your phone book. The CDC also has a tool to help you locate your state's health department, and many state websites also have listings of all of their local health departments.  3) Find a [facility] If your illness is not likely to be very severe or complicated, you may  want to try a walk in clinic. These are popping up all over the country in pharmacies, drugstores, and shopping centers. They're usually staffed by nurse practitioners or physician assistants that have been trained to treat common illnesses and complaints. They're usually fairly quick and inexpensive. However, if you have serious medical issues or chronic medical problems, these are probably not your best option.  No Primary Care Doctor: - Call Health Connect at  [phone] - they can help you locate a primary care doctor that  accepts your insurance, provides  certain services, etc. - Physician Referral Service- [phone]  Chronic Pain Problems: Organization         Address  Phone   Notes  [facility]  [phone] Patients need to be referred by their primary care doctor.   Medication Assistance: Organization         Address  Phone   Notes  [facility] Medication [facility] [address] [place], [place] [address] [phone] --Must be a resident of [facility] -- Must have NO insurance coverage whatsoever (no Medicaid/ Medicare, etc.) -- The pt. MUST have a primary care doctor that directs their care regularly and follows them in the community   [facility]  [phone]   [employer]  [phone]    Agencies that provide inexpensive medical care: Organization         Address  Phone   Notes  [place]  [phone]   [facility]    [phone]   [facility] [place], [place] [address] [phone]   [place] [address] [place] [phone]   [facility]    [phone]   [facility]    [phone]   [place] [address] [place] [address], [place] Phone:  [phone], Fax:  [phone] Hours of Operation:  9 am - 6 pm, M-F.  Also accepts Medicaid/Medicare and self-pay.  [facility] for [place] [address], [address], [place] Phone: [phone], Fax: [phone]. Hours of Operation:  8:30 am - 5:30 pm, M-F.  Also accepts Medicaid and self-pay.  [facility] [place] [address] [place] Phone: [phone]   [place], [place], [place] [phone], Ext. [phone] Mondays & Thursdays: 7-9 AM.  First 15 patients are seen on a first come, first serve basis.    [place] Providers:  Organization         Address  Phone   Notes  [facility] [address], [place] [phone] Also accepts self-pay patients.  [facility] [id number] [place], [place]  [phone]   [place], [address], [place] [phone]   [facility] [address] [place] [phone]   Lucianne Lei [address], [place]   [phone] Only accepts [place] patients after they have their name applied to their card.   Self-Pay (no insurance) in [facility]:  Organization         Address  Phone   Notes  Sickle Cell Patients, [facility] [place] [phone]   [facility] [place] [phone])  [facility] [place]  [address], [address], [place] [phone]   [facility]/Dr. Osei-Bonsu  [address] [place] or [address], [place] [phone] Phone number for both [place] and [place] locations is the same.  [facility] [address] [place] [phone]   [facility] [address] [place] or [address] [place] [address] [phone]   [facility] [address] [place] [phone], phone; [phone], fax Sees patients 1st and 3rd Saturday of every month.  Must not qualify for public or private insurance (i.e. Medicaid, Medicare, [place] Health Choice, Veterans' Benefits)  Household income should be no more than 200% of the poverty level The clinic cannot treat you if you are pregnant or think you are pregnant  Sexually transmitted diseases are not treated at the clinic.

## 2015-06-22 ENCOUNTER — Emergency Department (HOSPITAL_COMMUNITY)
Admission: EM | Admit: 2015-06-22 | Discharge: 2015-06-22 | Discharge status: Against Medical Advice | Payer: Self-pay | Attending: Emergency Medicine | Admitting: Emergency Medicine

## 2015-06-22 ENCOUNTER — Encounter (HOSPITAL_COMMUNITY): Payer: Self-pay | Admitting: Family Medicine

## 2015-06-22 DIAGNOSIS — H538 Other visual disturbances: Secondary | ICD-10-CM | POA: Insufficient documentation

## 2015-06-22 DIAGNOSIS — R519 Headache, unspecified: Secondary | ICD-10-CM

## 2015-06-22 DIAGNOSIS — Z79899 Other long term (current) drug therapy: Secondary | ICD-10-CM | POA: Insufficient documentation

## 2015-06-22 DIAGNOSIS — R112 Nausea with vomiting, unspecified: Secondary | ICD-10-CM | POA: Insufficient documentation

## 2015-06-22 DIAGNOSIS — Z8619 Personal history of other infectious and parasitic diseases: Secondary | ICD-10-CM | POA: Insufficient documentation

## 2015-06-22 DIAGNOSIS — H53149 Visual discomfort, unspecified: Secondary | ICD-10-CM | POA: Insufficient documentation

## 2015-06-22 DIAGNOSIS — J45909 Unspecified asthma, uncomplicated: Secondary | ICD-10-CM | POA: Insufficient documentation

## 2015-06-22 DIAGNOSIS — R51 Headache: Secondary | ICD-10-CM | POA: Insufficient documentation

## 2015-06-22 LAB — URINALYSIS, ROUTINE W REFLEX MICROSCOPIC
GLUCOSE, UA: NEGATIVE mg/dL
KETONES UR: 15 mg/dL — AB
Nitrite: NEGATIVE
PROTEIN: 100 mg/dL — AB
Specific Gravity, Urine: 1.023 (ref 1.005–1.030)
pH: 6.5 (ref 5.0–8.0)

## 2015-06-22 LAB — CBC WITH DIFFERENTIAL/PLATELET
BASOS ABS: 0 10*3/uL (ref 0.0–0.1)
Basophils Relative: 1 %
Eosinophils Absolute: 0.1 10*3/uL (ref 0.0–0.7)
Eosinophils Relative: 2 %
HEMATOCRIT: 42.1 % (ref 36.0–46.0)
Hemoglobin: 14 g/dL (ref 12.0–15.0)
LYMPHS PCT: 34 %
Lymphs Abs: 1.4 10*3/uL (ref 0.7–4.0)
MCH: 29 pg (ref 26.0–34.0)
MCHC: 33.3 g/dL (ref 30.0–36.0)
MCV: 87.3 fL (ref 78.0–100.0)
MONO ABS: 0.4 10*3/uL (ref 0.1–1.0)
MONOS PCT: 11 %
NEUTROS ABS: 2.2 10*3/uL (ref 1.7–7.7)
Neutrophils Relative %: 52 %
Platelets: 314 10*3/uL (ref 150–400)
RBC: 4.82 MIL/uL (ref 3.87–5.11)
RDW: 12.3 % (ref 11.5–15.5)
WBC: 4.1 10*3/uL (ref 4.0–10.5)

## 2015-06-22 LAB — COMPREHENSIVE METABOLIC PANEL
ALT: 18 U/L (ref 14–54)
AST: 18 U/L (ref 15–41)
Albumin: 3.8 g/dL (ref 3.5–5.0)
Alkaline Phosphatase: 54 U/L (ref 38–126)
Anion gap: 10 (ref 5–15)
BILIRUBIN TOTAL: 0.6 mg/dL (ref 0.3–1.2)
BUN: 6 mg/dL (ref 6–20)
CALCIUM: 8.9 mg/dL (ref 8.9–10.3)
CO2: 24 mmol/L (ref 22–32)
CREATININE: 0.57 mg/dL (ref 0.44–1.00)
Chloride: 103 mmol/L (ref 101–111)
GFR calc Af Amer: >60 mL/min (ref >60)
Glucose, Bld: 95 mg/dL (ref 65–99)
POTASSIUM: 3.6 mmol/L (ref 3.5–5.1)
Sodium: 137 mmol/L (ref 135–145)
TOTAL PROTEIN: 6.9 g/dL (ref 6.5–8.1)

## 2015-06-22 LAB — I-STAT BETA HCG BLOOD, ED (MC, WL, AP ONLY): I-stat hCG, quantitative: <5 m[IU]/mL (ref <5)

## 2015-06-22 LAB — URINE MICROSCOPIC-ADD ON

## 2015-06-22 LAB — LIPASE, BLOOD: LIPASE: 32 U/L (ref 11–51)

## 2015-06-22 MED ORDER — DIPHENHYDRAMINE HCL 50 MG/ML IJ SOLN
25.0000 mg | Freq: Once | INTRAMUSCULAR | Status: AC
  Administered 2015-06-22: 25 mg via INTRAVENOUS
  Filled 2015-06-22: qty 1

## 2015-06-22 MED ORDER — KETOROLAC TROMETHAMINE 30 MG/ML IJ SOLN
30.0000 mg | Freq: Once | INTRAMUSCULAR | Status: AC
  Administered 2015-06-22: 30 mg via INTRAVENOUS
  Filled 2015-06-22: qty 1

## 2015-06-22 MED ORDER — PROCHLORPERAZINE EDISYLATE 5 MG/ML IJ SOLN
10.0000 mg | Freq: Once | INTRAMUSCULAR | Status: AC
  Administered 2015-06-22: 10 mg via INTRAVENOUS
  Filled 2015-06-22: qty 2

## 2015-06-22 MED ORDER — SODIUM CHLORIDE 0.9 % IV BOLUS (SEPSIS)
1000.0000 mL | Freq: Once | INTRAVENOUS | Status: AC
  Administered 2015-06-22: 1000 mL via INTRAVENOUS

## 2015-06-22 NOTE — ED Notes (Signed)
Pt reports migraine x1 week with hx.  Pt reports she tried Excedrin at home two days ago with no relief.  Pt reports vomiting began two days ago from headache.

## 2015-06-22 NOTE — ED Notes (Addendum)
Pt refused to stand on scale for a weight. Pt was very agitated upon arrival. Pt stated, "I never wanted to come to this hospital because everyone is so rude".

## 2015-06-22 NOTE — ED Notes (Signed)
RN went into room to give pt warm blankets.  Pt was not in room.  IV had been d/c by pt.  PA made aware.

## 2015-06-22 NOTE — ED Notes (Signed)
Pt here for migraine x 5 days and no relief with meds. Sts vomiting.

## 2015-06-22 NOTE — ED Provider Notes (Signed)
CSN: [id number]     Arrival date & time 06/22/15  0700 History   First MD Initiated Contact with Patient 06/22/15 [phone]     Chief Complaint  Patient presents with  . Migraine     (Consider location/radiation/quality/duration/timing/severity/associated sxs/prior Treatment) HPI  Marcia Pham is a(n) 35 y.o. female who presents to the emergency department with chief complaint of migraine headache. She has a past medical history previous migraine headaches. Patient states she's had 4 days of frontal throbbing headache which has been progressively worsening with associated nausea, vomiting, photophobia, phonophobia, and blurry vision. She states that this is normal for her migraine headaches. She does acknowledge that this is one of the most severe headaches. She's had. She denies fevers, chills. She tried Excedrin migraine 2 days ago without relief of her symptoms. Past Medical History  Diagnosis Date  . Shingles   . Migraines   . Asthma    Past Surgical History  Procedure Laterality Date  . Arm surgery     History reviewed. No pertinent family history. Social History  Substance Use Topics  . Smoking status: Never Smoker   . Smokeless tobacco: None  . Alcohol Use: No     Comment: "once a month"   OB History    No data available     Review of Systems   Ten systems reviewed and are negative for acute change, except as noted in the HPI.   Allergies  Review of patient's allergies indicates no known allergies.  Home Medications   Prior to Admission medications   Medication Sig Start Date End Date Taking? Authorizing Provider  albuterol (PROVENTIL HFA;VENTOLIN HFA) 108 (90 BASE) MCG/ACT inhaler Inhale 2 puffs into the lungs every 4 (four) hours as needed for wheezing or shortness of breath. Patient not taking: Reported on 03/07/2015 03/30/13   Jola Schmidt, MD  albuterol (PROVENTIL HFA;VENTOLIN HFA) 108 (90 BASE) MCG/ACT inhaler Inhale 1-2 puffs into the lungs every 6 (six) hours  as needed for wheezing or shortness of breath. 03/07/15   Olivia Canter Sam, PA-C  benzonatate (TESSALON) 100 MG capsule Take 1 capsule (100 mg total) by mouth every 8 (eight) hours. 03/07/15   Olivia Canter Sam, PA-C  guaiFENesin (MUCINEX) 600 MG 12 hr tablet Take 1 tablet (600 mg total) by mouth 2 (two) times daily. 03/07/15   Olivia Canter Sam, PA-C  ibuprofen (ADVIL,MOTRIN) 800 MG tablet Take 1 tablet (800 mg total) by mouth every 8 (eight) hours as needed for mild pain or moderate pain. Patient not taking: Reported on 03/07/2015 10/23/14   Clayton Bibles, PA-C  ibuprofen (ADVIL,MOTRIN) 800 MG tablet Take 1 tablet (800 mg total) by mouth every 8 (eight) hours as needed. 03/07/15   Olivia Canter Sam, PA-C  promethazine (PHENERGAN) 25 MG tablet Take 1 tablet (25 mg total) by mouth every 6 (six) hours as needed for nausea. Patient not taking: Reported on 03/07/2015 10/08/14   Domenic Moras, PA-C  trimethoprim-polymyxin b (POLYTRIM) ophthalmic solution Place 1 drop into the right eye every 4 (four) hours. Patient not taking: Reported on 03/07/2015 10/23/14   Clayton Bibles, PA-C   BP 130/86 mmHg  Pulse 68  Temp(Src) 97.9 F (36.6 C) (Oral)  Resp 18  SpO2 100% Physical Exam  Constitutional: She is oriented to person, place, and time. She appears well-developed and well-nourished. No distress.  Patient is actively vomiting   HENT:  Head: Normocephalic and atraumatic.  Mouth/Throat: Oropharynx is clear and moist.  Eyes: Conjunctivae and EOM are normal.  Pupils are equal, round, and reactive to light. No scleral icterus.  No horizontal, vertical or rotational nystagmus  Neck: Normal range of motion. Neck supple.  Full active and passive ROM without pain No midline or paraspinal tenderness No nuchal rigidity or meningeal signs  Cardiovascular: Normal rate, regular rhythm and intact distal pulses.   Pulmonary/Chest: Effort normal and breath sounds normal. No respiratory distress. She has no wheezes. She has no rales.   Abdominal: Soft. Bowel sounds are normal. There is no tenderness. There is no rebound and no guarding.  Musculoskeletal: Normal range of motion.  Lymphadenopathy:    She has no cervical adenopathy.  Neurological: She is alert and oriented to person, place, and time. She has normal reflexes. No cranial nerve deficit. She exhibits normal muscle tone. Coordination normal.  Mental Status:  Alert, oriented, thought content appropriate. Speech fluent without evidence of aphasia. Able to follow 2 step commands without difficulty.  Cranial Nerves:  II:  Peripheral visual fields grossly normal, pupils equal, round, reactive to light III,IV, VI: ptosis not present, extra-ocular motions intact bilaterally  V,VII: smile symmetric, facial light touch sensation equal VIII: hearing grossly normal bilaterally  IX,X: midline uvula rise  XI: bilateral shoulder shrug equal and strong XII: midline tongue extension  Motor:  5/5 in upper and lower extremities bilaterally including strong and equal grip strength and dorsiflexion/plantar flexion Sensory: Pinprick and light touch normal in all extremities.  Deep Tendon Reflexes: 2+ and symmetric  Cerebellar: normal finger-to-nose with bilateral upper extremities Gait: normal gait and balance CV: distal pulses palpable throughout   Skin: Skin is warm and dry. No rash noted. She is not diaphoretic.  Psychiatric: She has a normal mood and affect. Her behavior is normal. Judgment and thought content normal.  Nursing note and vitals reviewed.   ED Course  Procedures (including critical care time) Labs Review Labs Reviewed  CBC WITH DIFFERENTIAL/PLATELET  COMPREHENSIVE METABOLIC PANEL  LIPASE, BLOOD  URINALYSIS, ROUTINE W REFLEX MICROSCOPIC (NOT AT [facility])  I-STAT BETA HCG BLOOD, ED ([facility], [facility], [facility] ONLY)    Imaging Review No results found. I have personally reviewed and evaluated these images and lab results as part of my medical decision-making.   EKG  Interpretation None      MDM   Final diagnoses:  None   Patient eloped before reevaluation.      Margarita Mail, PA-C 06/22/15 [place], MD 06/22/15 [phone]

## 2015-06-22 NOTE — ED Notes (Signed)
Pt refused to put on gown. 

## 2015-07-09 ENCOUNTER — Encounter (HOSPITAL_COMMUNITY): Payer: Self-pay | Admitting: Emergency Medicine

## 2015-07-09 ENCOUNTER — Emergency Department (HOSPITAL_COMMUNITY)
Admission: EM | Admit: 2015-07-09 | Discharge: 2015-07-09 | Disposition: A | Discharge status: Home / Self Care | Payer: Self-pay | Attending: Emergency Medicine | Admitting: Emergency Medicine

## 2015-07-09 DIAGNOSIS — J302 Other seasonal allergic rhinitis: Secondary | ICD-10-CM

## 2015-07-09 DIAGNOSIS — J069 Acute upper respiratory infection, unspecified: Secondary | ICD-10-CM

## 2015-07-09 DIAGNOSIS — R05 Cough: Secondary | ICD-10-CM

## 2015-07-09 DIAGNOSIS — Z8679 Personal history of other diseases of the circulatory system: Secondary | ICD-10-CM | POA: Insufficient documentation

## 2015-07-09 DIAGNOSIS — R059 Cough, unspecified: Secondary | ICD-10-CM

## 2015-07-09 DIAGNOSIS — Z8619 Personal history of other infectious and parasitic diseases: Secondary | ICD-10-CM | POA: Insufficient documentation

## 2015-07-09 DIAGNOSIS — J301 Allergic rhinitis due to pollen: Secondary | ICD-10-CM | POA: Insufficient documentation

## 2015-07-09 DIAGNOSIS — Z79899 Other long term (current) drug therapy: Secondary | ICD-10-CM | POA: Insufficient documentation

## 2015-07-09 MED ORDER — CETIRIZINE HCL 10 MG PO TABS: 10.0000 mg | ORAL_TABLET | Freq: Every day | ORAL | Status: DC

## 2015-07-09 NOTE — ED Notes (Signed)
Patient comes from home. States she has had a cough x2 weeks. Patient states productive with clear sputum. Patient denise any other complaints. Denies chest pain, SOB, N/V. Patient breath sounds clear in all fields upon assessment.

## 2015-07-09 NOTE — ED Provider Notes (Signed)
CSN: [id number]     Arrival date & time 07/09/15  0542 History   First MD Initiated Contact with Patient 07/09/15 [phone]     Chief Complaint  Patient presents with  . Cough     (Consider location/radiation/quality/duration/timing/severity/associated sxs/prior Treatment) HPI Comments: Patient presents to the ED with a chief complaint of cough.  She states that she has had a dry cough for the past 2 weeks.  She reports having seasonal allergies with associated rhinorrhea.  She denies any fevers, chills, nausea, vomiting, or SOB.  There are no modifying factors.  She has tried taking nyquil and benadryl with mild relief.  The history is provided by the patient. No language interpreter was used.    Past Medical History  Diagnosis Date  . Shingles   . Migraines   . Asthma    Past Surgical History  Procedure Laterality Date  . Arm surgery     No family history on file. Social History  Substance Use Topics  . Smoking status: Never Smoker   . Smokeless tobacco: None  . Alcohol Use: No     Comment: "once a month"   OB History    No data available     Review of Systems  Constitutional: Negative for fever and chills.  HENT: Positive for postnasal drip and rhinorrhea.   Respiratory: Positive for cough. Negative for shortness of breath.   Cardiovascular: Negative for chest pain.  Gastrointestinal: Negative for nausea, vomiting, diarrhea and constipation.  Genitourinary: Negative for dysuria.      Allergies  Review of patient's allergies indicates no known allergies.  Home Medications   Prior to Admission medications   Medication Sig Start Date End Date Taking? Authorizing Provider  albuterol (PROVENTIL HFA;VENTOLIN HFA) 108 (90 BASE) MCG/ACT inhaler Inhale 1-2 puffs into the lungs every 6 (six) hours as needed for wheezing or shortness of breath. 03/07/15  Yes Olivia Canter Sam, PA-C  cetirizine (ZYRTEC ALLERGY) 10 MG tablet Take 1 tablet (10 mg total) by mouth daily. 07/09/15    Montine Circle, PA-C   BP 123/82 mmHg  Pulse 76  Temp(Src) 98.1 F (36.7 C) (Oral)  Resp 18  SpO2 99%  LMP 06/22/2015 Physical Exam  Constitutional: She appears well-developed and well-nourished. No distress.  HENT:  Head: Normocephalic.  Right Ear: External ear normal.  Left Ear: External ear normal.  Mildly erythematous, no tonsillar exudate, no abscess, no stridor, uvula is midline  TMs clear bilaterally  Eyes: Conjunctivae and EOM are normal. Pupils are equal, round, and reactive to light.  Neck: Normal range of motion. Neck supple.  Cardiovascular: Normal rate, regular rhythm and normal heart sounds.  Exam reveals no gallop and no friction rub.   No murmur heard. Pulmonary/Chest: Effort normal and breath sounds normal. No stridor. No respiratory distress. She has no wheezes. She has no rales. She exhibits no tenderness.  CTAB  Abdominal: Soft. Bowel sounds are normal. She exhibits no distension. There is no tenderness.  Musculoskeletal: Normal range of motion. She exhibits no tenderness.  Neurological: She is alert.  Skin: Skin is warm and dry. No rash noted. She is not diaphoretic.  Psychiatric: She has a normal mood and affect. Her behavior is normal. Judgment and thought content normal.  Nursing note and vitals reviewed.   ED Course  Procedures (including critical care time)   MDM   Final diagnoses:  Cough  Seasonal allergies  URI (upper respiratory infection)    Patient with dry cough x 2 weeks.  She has seasonal allergies.  Lungs are CTAB.  Afebrile, VSS.  Highly doubt pneumonia.  No CXR today, but instructed to return if she becomes febrile or has thick productive cough.  Will start patient on zyrtec.  Return precautions discussed.  Patient is stable and ready for discharge.    Montine Circle, PA-C [phone] [id number]  Delora Fuel, MD [id number]

## 2015-07-09 NOTE — Discharge Instructions (Signed)
Allergic Rhinitis Allergic rhinitis is when the mucous membranes in the nose respond to allergens. Allergens are particles in the air that cause your body to have an allergic reaction. This causes you to release allergic antibodies. Through a chain of events, these eventually cause you to release histamine into the blood stream. Although meant to protect the body, it is this release of histamine that causes your discomfort, such as frequent sneezing, congestion, and an itchy, runny nose.  CAUSES Seasonal allergic rhinitis (hay fever) is caused by pollen allergens that may come from grasses, trees, and weeds. Year-round allergic rhinitis (perennial allergic rhinitis) is caused by allergens such as house dust mites, pet dander, and mold spores. SYMPTOMS  Nasal stuffiness (congestion).  Itchy, runny nose with sneezing and tearing of the eyes. DIAGNOSIS Your health care provider can help you determine the allergen or allergens that trigger your symptoms. If you and your health care provider are unable to determine the allergen, skin or blood testing may be used. Your health care provider will diagnose your condition after taking your health history and performing a physical exam. Your health care provider may assess you for other related conditions, such as asthma, pink eye, or an ear infection. TREATMENT Allergic rhinitis does not have a cure, but it can be controlled by:  Medicines that block allergy symptoms. These may include allergy shots, nasal sprays, and oral antihistamines.  Avoiding the allergen. Hay fever may often be treated with antihistamines in pill or nasal spray forms. Antihistamines block the effects of histamine. There are over-the-counter medicines that may help with nasal congestion and swelling around the eyes. Check with your health care provider before taking or giving this medicine. If avoiding the allergen or the medicine prescribed do not work, there are many new medicines  your health care provider can prescribe. Stronger medicine may be used if initial measures are ineffective. Desensitizing injections can be used if medicine and avoidance does not work. Desensitization is when a patient is given ongoing shots until the body becomes less sensitive to the allergen. Make sure you follow up with your health care provider if problems continue. HOME CARE INSTRUCTIONS It is not possible to completely avoid allergens, but you can reduce your symptoms by taking steps to limit your exposure to them. It helps to know exactly what you are allergic to so that you can avoid your specific triggers. SEEK MEDICAL CARE IF:  You have a fever.  You develop a cough that does not stop easily (persistent).  You have shortness of breath.  You start wheezing. Symptoms interfere with normal daily activities.  Upper Respiratory Infection, Adult Most upper respiratory infections (URIs) are a viral infection of the air passages leading to the lungs. A URI affects the nose, throat, and upper air passages. The most common type of URI is nasopharyngitis and is typically referred to as "the common cold." URIs run their course and usually go away on their own. Most of the time, a URI does not require medical attention, but sometimes a bacterial infection in the upper airways can follow a viral infection. This is called a secondary infection. Sinus and middle ear infections are common types of secondary upper respiratory infections. Bacterial pneumonia can also complicate a URI. A URI can worsen asthma and chronic obstructive pulmonary disease (COPD). Sometimes, these complications can require emergency medical care and may be life threatening.  CAUSES Almost all URIs are caused by viruses. A virus is a type of germ and can  spread from one person to another.  RISKS FACTORS You may be at risk for a URI if:   You smoke.   You have chronic heart or lung disease.  You have a weakened defense  (immune) system.   You are very young or very old.   You have nasal allergies or asthma.  You work in crowded or poorly ventilated areas.  You work in health care facilities or schools. SIGNS AND SYMPTOMS  Symptoms typically develop 2-3 days after you come in contact with a cold virus. Most viral URIs last 7-10 days. However, viral URIs from the influenza virus (flu virus) can last 14-18 days and are typically more severe. Symptoms may include:   Runny or stuffy (congested) nose.   Sneezing.   Cough.   Sore throat.   Headache.   Fatigue.   Fever.   Loss of appetite.   Pain in your forehead, behind your eyes, and over your cheekbones (sinus pain).  Muscle aches.  DIAGNOSIS  Your health care provider may diagnose a URI by:  Physical exam.  Tests to check that your symptoms are not due to another condition such as:  Strep throat.  Sinusitis.  Pneumonia.  Asthma. TREATMENT  A URI goes away on its own with time. It cannot be cured with medicines, but medicines may be prescribed or recommended to relieve symptoms. Medicines may help:  Reduce your fever.  Reduce your cough.  Relieve nasal congestion. HOME CARE INSTRUCTIONS   Take medicines only as directed by your health care provider.   Gargle warm saltwater or take cough drops to comfort your throat as directed by your health care provider.  Use a warm mist humidifier or inhale steam from a shower to increase air moisture. This may make it easier to breathe.  Drink enough fluid to keep your urine clear or pale yellow.   Eat soups and other clear broths and maintain good nutrition.   Rest as needed.   Return to work when your temperature has returned to normal or as your health care provider advises. You may need to stay home longer to avoid infecting others. You can also use a face mask and careful hand washing to prevent spread of the virus.  Increase the usage of your inhaler if you  have asthma.   Do not use any tobacco products, including cigarettes, chewing tobacco, or electronic cigarettes. If you need help quitting, ask your health care provider. PREVENTION  The best way to protect yourself from getting a cold is to practice good hygiene.   Avoid oral or hand contact with people with cold symptoms.   Wash your hands often if contact occurs.  There is no clear evidence that vitamin C, vitamin E, echinacea, or exercise reduces the chance of developing a cold. However, it is always recommended to get plenty of rest, exercise, and practice good nutrition.  SEEK MEDICAL CARE IF:   You are getting worse rather than better.   Your symptoms are not controlled by medicine.   You have chills.  You have worsening shortness of breath.  You have brown or red mucus.  You have yellow or brown nasal discharge.  You have pain in your face, especially when you bend forward.  You have a fever.  You have swollen neck glands.  You have pain while swallowing.  You have white areas in the back of your throat. SEEK IMMEDIATE MEDICAL CARE IF:   You have severe or persistent:  Headache.  Ear pain.  Sinus pain.  Chest pain.  You have chronic lung disease and any of the following:  Wheezing.  Prolonged cough.  Coughing up blood.  A change in your usual mucus.  You have a stiff neck.  You have changes in your:  Vision.  Hearing.  Thinking.  Mood. MAKE SURE YOU:   Understand these instructions.  Will watch your condition.  Will get help right away if you are not doing well or get worse.   This information is not intended to replace advice given to you by your health care provider. Make sure you discuss any questions you have with your health care provider.   Document Released: 08/27/2000 Document Revised: 07/18/2014 Document Reviewed: 06/08/2013 Elsevier Interactive Patient Education [employer].     This information is not  intended to replace advice given to you by your health care provider. Make sure you discuss any questions you have with your health care provider.   Document Released: 11/26/2000 Document Revised: 03/24/2014 Document Reviewed: 11/08/2012 Elsevier Interactive Patient Education [employer].

## 2015-08-17 ENCOUNTER — Emergency Department (HOSPITAL_COMMUNITY)
Admission: EM | Admit: 2015-08-17 | Discharge: 2015-08-17 | Disposition: A | Discharge status: Home / Self Care | Payer: Self-pay | Attending: Emergency Medicine | Admitting: Emergency Medicine

## 2015-08-17 ENCOUNTER — Encounter (HOSPITAL_COMMUNITY): Payer: Self-pay | Admitting: Emergency Medicine

## 2015-08-17 DIAGNOSIS — R053 Chronic cough: Secondary | ICD-10-CM

## 2015-08-17 DIAGNOSIS — R05 Cough: Secondary | ICD-10-CM | POA: Insufficient documentation

## 2015-08-17 DIAGNOSIS — J45909 Unspecified asthma, uncomplicated: Secondary | ICD-10-CM | POA: Insufficient documentation

## 2015-08-17 DIAGNOSIS — Z79899 Other long term (current) drug therapy: Secondary | ICD-10-CM | POA: Insufficient documentation

## 2015-08-17 MED ORDER — BENZONATATE 100 MG PO CAPS: 100.0000 mg | ORAL_CAPSULE | Freq: Three times a day (TID) | ORAL | Status: DC

## 2015-08-17 MED ORDER — GUAIFENESIN 100 MG/5ML PO LIQD: 100.0000 mg | ORAL | Status: DC | PRN

## 2015-08-17 MED ORDER — AMOXICILLIN 500 MG PO CAPS: 500.0000 mg | ORAL_CAPSULE | Freq: Three times a day (TID) | ORAL | Status: DC

## 2015-08-17 NOTE — Discharge Instructions (Signed)
Please use the number included in your discharge paper to find a primary care provider for further management of your health.    Cough, Adult Coughing is a reflex that clears your throat and your airways. Coughing helps to heal and protect your lungs. It is normal to cough occasionally, but a cough that happens with other symptoms or lasts a long time may be a sign of a condition that needs treatment. A cough may last only 2-3 weeks (acute), or it may last longer than 8 weeks (chronic). CAUSES Coughing is commonly caused by:  Breathing in substances that irritate your lungs.  A viral or bacterial respiratory infection.  Allergies.  Asthma.  Postnasal drip.  Smoking.  Acid backing up from the stomach into the esophagus (gastroesophageal reflux).  Certain medicines.  Chronic lung problems, including COPD (or rarely, lung cancer).  Other medical conditions such as heart failure. HOME CARE INSTRUCTIONS  Pay attention to any changes in your symptoms. Take these actions to help with your discomfort:  Take medicines only as told by your health care provider.  If you were prescribed an antibiotic medicine, take it as told by your health care provider. Do not stop taking the antibiotic even if you start to feel better.  Talk with your health care provider before you take a cough suppressant medicine.  Drink enough fluid to keep your urine clear or pale yellow.  If the air is dry, use a cold steam vaporizer or humidifier in your bedroom or your home to help loosen secretions.  Avoid anything that causes you to cough at work or at home.  If your cough is worse at night, try sleeping in a semi-upright position.  Avoid cigarette smoke. If you smoke, quit smoking. If you need help quitting, ask your health care provider.  Avoid caffeine.  Avoid alcohol.  Rest as needed. SEEK MEDICAL CARE IF:   You have new symptoms.  You cough up pus.  Your cough does not get better after  2-3 weeks, or your cough gets worse.  You cannot control your cough with suppressant medicines and you are losing sleep.  You develop pain that is getting worse or pain that is not controlled with pain medicines.  You have a fever.  You have unexplained weight loss.  You have night sweats. SEEK IMMEDIATE MEDICAL CARE IF:  You cough up blood.  You have difficulty breathing.  Your heartbeat is very fast.   This information is not intended to replace advice given to you by your health care provider. Make sure you discuss any questions you have with your health care provider.   Document Released: 08/30/2010 Document Revised: 11/22/2014 Document Reviewed: 05/10/2014 Elsevier Interactive Patient Education [employer].

## 2015-08-17 NOTE — ED Notes (Signed)
Pt arrives via POv from home with nasal congestion, sinus pain and cough for the last 3 weeks. Pt reports using OTC medications without relief. Lungs CTA. Denies recent fever. VSS.

## 2015-08-17 NOTE — ED Provider Notes (Signed)
CSN: [id number]     Arrival date & time 08/17/15  1143 History  By signing my name below, I, Marcia Pham, attest that this documentation has been prepared under the direction and in the presence of Domenic Moras, PA-C. Electronically Signed: Rayna Pham, ED Scribe. 08/17/2015. 1:07 PM.   Chief Complaint  Patient presents with  . URI  . Cough   The history is provided by the patient. No language interpreter was used.    HPI Comments: Marcia Pham is a 35 y.o. female with a PMHx of asthma who presents to the Emergency Department complaining of constant, moderate, congestion x 1 month. She reports associated, moderate, productive cough with yellow sputum, sneezing, eye itching, sinus pressure and CP when coughing. She denies any known sick contacts but states that she is concerned regarding black mold in her home. She has taken Nyquil, Dayquil, Theraflu, Robitussin and Flonase without significant relief. She denies any recent changes in soaps, detergents, medications or pets. She denies a PMHx of DM. Pt denies a hx of smoking. Pt denies ear pain, sore throat, SOB and rash.   Past Medical History  Diagnosis Date  . Shingles   . Migraines   . Asthma    Past Surgical History  Procedure Laterality Date  . Arm surgery     No family history on file. Social History  Substance Use Topics  . Smoking status: Never Smoker   . Smokeless tobacco: Not on file  . Alcohol Use: No     Comment: "once a month"   OB History    No data available     Review of Systems  HENT: Positive for congestion, rhinorrhea, sinus pressure and sneezing. Negative for ear discharge, ear pain and sore throat.   Eyes: Positive for itching.  Respiratory: Positive for cough. Negative for shortness of breath.   Skin: Negative for rash.    Allergies  Review of patient's allergies indicates no known allergies.  Home Medications   Prior to Admission medications   Medication Sig Start Date End Date Taking?  Authorizing Provider  albuterol (PROVENTIL HFA;VENTOLIN HFA) 108 (90 BASE) MCG/ACT inhaler Inhale 1-2 puffs into the lungs every 6 (six) hours as needed for wheezing or shortness of breath. 03/07/15   Olivia Canter Sam, PA-C  cetirizine (ZYRTEC ALLERGY) 10 MG tablet Take 1 tablet (10 mg total) by mouth daily. 07/09/15   Montine Circle, PA-C   BP 131/100 mmHg  Pulse 89  Temp(Src) 98.8 F (37.1 C) (Oral)  Resp 16  SpO2 97%  LMP 08/15/2015    Physical Exam  Constitutional: She is oriented to person, place, and time. She appears well-developed.  African American female sitting upright, intermittently coughing and speaking in complete sentences   HENT:  Head: Normocephalic and atraumatic.  Right Ear: Tympanic membrane, external ear and ear canal normal.  Left Ear: Tympanic membrane, external ear and ear canal normal.  Nose: Rhinorrhea present.  Mouth/Throat: Uvula is midline, oropharynx is clear and moist and mucous membranes are normal.  Boggy nasal turbinates  Eyes: EOM are normal.  Neck: Normal range of motion. No tracheal deviation present.  Cardiovascular: Normal rate, regular rhythm and normal heart sounds.  Exam reveals no gallop and no friction rub.   No murmur heard. Pulmonary/Chest: Effort normal and breath sounds normal. No respiratory distress. She has no wheezes. She has no rales.  Abdominal: Soft.  Musculoskeletal: Normal range of motion.  Neurological: She is alert and oriented to person, place, and time.  Skin: Skin is warm and dry.  Psychiatric: She has a normal mood and affect.  Nursing note and vitals reviewed.   ED Course  Procedures  DIAGNOSTIC STUDIES: Oxygen Saturation is 97% on RA, normal by my interpretation.    COORDINATION OF CARE: 12:42 PM Pt presents with cough and congestion for the past month. Has exposure to black mold.  She is afebrile, VSS.  Lungs CTAB.  Due to the prolonged duration of her sxs. Discussed next steps with pt including abx, PCP referral  and recommendation to continue use of Flonase. I also recommend getting professional treatment to get rid of the black mold at home as it can be the source of her sickness.  Pt verbalized understanding and is agreeable with the plan.     MDM   Final diagnoses:  Persistent cough    BP 131/100 mmHg  Pulse 89  Temp(Src) 98.8 F (37.1 C) (Oral)  Resp 16  SpO2 97%  LMP 08/15/2015  I personally performed the services described in this documentation, which was scribed in my presence. The recorded information has been reviewed and is accurate.      Domenic Moras, PA-C 08/17/15 [place], MD 08/18/15 0900

## 2015-09-10 ENCOUNTER — Emergency Department (HOSPITAL_COMMUNITY)
Admission: EM | Admit: 2015-09-10 | Discharge: 2015-09-10 | Disposition: A | Discharge status: Home / Self Care | Payer: MEDICAID | Attending: Emergency Medicine | Admitting: Emergency Medicine

## 2015-09-10 ENCOUNTER — Emergency Department (HOSPITAL_COMMUNITY)
Admission: EM | Admit: 2015-09-10 | Discharge: 2015-09-10 | Disposition: A | Discharge status: Home / Self Care | Payer: Self-pay | Attending: Dermatology | Admitting: Dermatology

## 2015-09-10 ENCOUNTER — Encounter (HOSPITAL_COMMUNITY): Payer: Self-pay | Admitting: Nurse Practitioner

## 2015-09-10 ENCOUNTER — Encounter (HOSPITAL_COMMUNITY): Payer: Self-pay | Admitting: Emergency Medicine

## 2015-09-10 DIAGNOSIS — Z79899 Other long term (current) drug therapy: Secondary | ICD-10-CM | POA: Insufficient documentation

## 2015-09-10 DIAGNOSIS — G43909 Migraine, unspecified, not intractable, without status migrainosus: Secondary | ICD-10-CM | POA: Insufficient documentation

## 2015-09-10 DIAGNOSIS — J45909 Unspecified asthma, uncomplicated: Secondary | ICD-10-CM | POA: Insufficient documentation

## 2015-09-10 DIAGNOSIS — Z5321 Procedure and treatment not carried out due to patient leaving prior to being seen by health care provider: Secondary | ICD-10-CM | POA: Insufficient documentation

## 2015-09-10 DIAGNOSIS — K0889 Other specified disorders of teeth and supporting structures: Secondary | ICD-10-CM | POA: Insufficient documentation

## 2015-09-10 NOTE — ED Notes (Signed)
Called a third time no response.

## 2015-09-10 NOTE — ED Notes (Signed)
Pt c/o migraine headache and dental pain to left lower jaw at site of broken tooth onset yesterday. Headaches feel characteristic of past migraines.

## 2015-09-10 NOTE — ED Notes (Signed)
Upon completion of triage, ed process/wait times explained to patient. She states she is not going to wait to see doctor. Encouraged pt to stay for md evaluation but she left.

## 2015-09-10 NOTE — ED Notes (Signed)
She c/o 1 day history of dental pain and migraine headache. She c/o L lower dental pain which has caused her to have a migraine. She has taken tylenol and advil with no relief. She is alert and breathing easily

## 2015-09-10 NOTE — ED Notes (Signed)
Pt called x 2 and no response.

## 2015-09-10 NOTE — ED Notes (Addendum)
Patient with headache and dental pain.  Patient states that she is having pain at the bottom jaw, left side in the back.  She has been using Advil with no relief.  Patient states that it has been going on for two days.  Patient states that the tooth pain is worse than the headache.

## 2015-09-11 ENCOUNTER — Emergency Department (HOSPITAL_COMMUNITY)
Admission: EM | Admit: 2015-09-11 | Discharge: 2015-09-11 | Disposition: A | Discharge status: Home / Self Care | Payer: Self-pay | Attending: Emergency Medicine | Admitting: Emergency Medicine

## 2015-09-11 DIAGNOSIS — K029 Dental caries, unspecified: Secondary | ICD-10-CM | POA: Insufficient documentation

## 2015-09-11 DIAGNOSIS — J45909 Unspecified asthma, uncomplicated: Secondary | ICD-10-CM | POA: Insufficient documentation

## 2015-09-11 DIAGNOSIS — K0889 Other specified disorders of teeth and supporting structures: Secondary | ICD-10-CM

## 2015-09-11 MED ORDER — PENICILLIN V POTASSIUM 500 MG PO TABS
500.0000 mg | ORAL_TABLET | Freq: Four times a day (QID) | ORAL | Status: AC
Start: 2015-09-11 — End: 2015-09-18

## 2015-09-11 MED ORDER — IBUPROFEN 800 MG PO TABS: 800.0000 mg | ORAL_TABLET | Freq: Three times a day (TID) | ORAL | Status: DC

## 2015-09-11 MED ORDER — HYDROCODONE-ACETAMINOPHEN 5-325 MG PO TABS
1.0000 | ORAL_TABLET | Freq: Once | ORAL | Status: AC
  Administered 2015-09-11: 1 via ORAL
  Filled 2015-09-11: qty 1

## 2015-09-11 NOTE — ED Notes (Signed)
Pt and guest was to upset to wait for RN to discharge and given Rx; Pt left with Antibiotic Rx

## 2015-09-11 NOTE — Discharge Instructions (Signed)
You have a dental injury. It is very important that you get evaluated by a dentist as soon as possible. Call tomorrow to schedule an appointment. Ibuprofen as needed for pain. Take your full course of antibiotics. Read the instructions below. ° °Eat a soft or liquid diet and rinse your mouth out after meals with warm water. You should see a dentist or return here at once if you have increased swelling, increased pain or uncontrolled bleeding from the site of your injury. ° °SEEK MEDICAL CARE IF:  °You have increased pain not controlled with medicines.  °You have swelling around your tooth, in your face or neck.  °You have bleeding which starts, continues, or gets worse.  °You have a fever >101 °If you are unable to open your mouth °

## 2015-09-11 NOTE — ED Provider Notes (Signed)
CSN: [id number]     Arrival date & time 09/10/15  2148 History   First MD Initiated Contact with Patient 09/11/15 [phone]     Chief Complaint  Patient presents with  . Migraine  . Dental Pain    (Consider location/radiation/quality/duration/timing/severity/associated sxs/prior Treatment) Patient is a 35 y.o. female presenting with migraines and tooth pain.  Migraine Pertinent negatives include no fever.  Dental Pain Associated symptoms: no facial swelling and no fever     Marcia Pham is a 35 y.o. female who presents to the Emergency Department complaining of gradually worsening throbbing bottom left tooth pain x 2 days. No difficulty breathing, shortness of breath, fever/chills. Advil and tylenol at home with no relief. Not followed by dentistry.    Past Medical History  Diagnosis Date  . Shingles   . Migraines   . Asthma    Past Surgical History  Procedure Laterality Date  . Arm surgery     No family history on file. Social History  Substance Use Topics  . Smoking status: Never Smoker   . Smokeless tobacco: None  . Alcohol Use: No     Comment: "once a month"   OB History    No data available     Review of Systems  Constitutional: Negative for fever.  HENT: Negative for facial swelling and trouble swallowing.   Respiratory: Negative for shortness of breath.       Allergies  Review of patient's allergies indicates no known allergies.  Home Medications   Prior to Admission medications   Medication Sig Start Date End Date Taking? Authorizing Provider  albuterol (PROVENTIL HFA;VENTOLIN HFA) 108 (90 BASE) MCG/ACT inhaler Inhale 1-2 puffs into the lungs every 6 (six) hours as needed for wheezing or shortness of breath. 03/07/15   Olivia Canter Sam, PA-C  amoxicillin (AMOXIL) 500 MG capsule Take 1 capsule (500 mg total) by mouth 3 (three) times daily. 08/17/15   Domenic Moras, PA-C  benzonatate (TESSALON) 100 MG capsule Take 1 capsule (100 mg total) by mouth every 8 (eight)  hours. 08/17/15   Domenic Moras, PA-C  cetirizine (ZYRTEC ALLERGY) 10 MG tablet Take 1 tablet (10 mg total) by mouth daily. 07/09/15   Montine Circle, PA-C  guaiFENesin (ROBITUSSIN) 100 MG/5ML liquid Take 5-10 mLs (100-200 mg total) by mouth every 4 (four) hours as needed for cough. 08/17/15   Domenic Moras, PA-C  ibuprofen (ADVIL,MOTRIN) 800 MG tablet Take 1 tablet (800 mg total) by mouth 3 (three) times daily. 09/11/15   Ozella Almond Ward, PA-C  penicillin v potassium (VEETID) 500 MG tablet Take 1 tablet (500 mg total) by mouth 4 (four) times daily. 09/11/15 09/18/15  Jaime Pilcher Ward, PA-C   BP 120/64 mmHg  Pulse 74  Temp(Src) 98.4 F (36.9 C) (Oral)  Resp 17  SpO2 99%  LMP 08/15/2015 Physical Exam  Constitutional: She is oriented to person, place, and time. She appears well-developed and well-nourished.  Tearful, NAD.   HENT:  Head: Normocephalic and atraumatic.  Mouth/Throat:    Dental cavities and poor oral dentition noted, pain along tooth as depicted in image, midline uvula, no trismus, oropharynx moist and clear, no abscess noted, no oropharyngeal erythema or edema, neck supple and no tenderness. No facial edema  Cardiovascular: Normal rate, regular rhythm and normal heart sounds.  Exam reveals no gallop and no friction rub.   No murmur heard. Pulmonary/Chest: Effort normal and breath sounds normal. No respiratory distress. She has no wheezes. She has no rales. She exhibits  no tenderness.  Abdominal: Soft. She exhibits no distension. There is no tenderness.  Musculoskeletal: She exhibits no edema.  Neurological: She is alert and oriented to person, place, and time.  Skin: Skin is warm and dry.  Nursing note and vitals reviewed.   ED Course  Procedures (including critical care time) Labs Review Labs Reviewed - No data to display  Imaging Review No results found. I have personally reviewed and evaluated these images and lab results as part of my medical decision-making.   EKG  Interpretation None      MDM   Final diagnoses:  Dentalgia   Patient with dentalgia. No abscess requiring immediate incision and drainage. Patient is afebrile, non toxic appearing, and swallowing secretions well. Exam not concerning for Ludwig's angina or pharyngeal abscess. 1 dose pain meds given in ED, patient informed of no rx for narcotic pain meds. Will treat with PenVK and ibuprofen for pain. I provided dental resource guide and stressed the importance of dental follow up for ultimate management of dental pain. Patient voices understanding and agreed with plan. While printing discharge paperwork, patient went to the doors to leave. I informed patient that I was printing her prescription and paperwork with dental resources right now. Patient proceeded to walk out without discharge paperwork or rx.     Periodontal Exam   [facility] Ward, PA-C 09/11/15 [place], MD 09/11/15 [phone]

## 2015-09-12 ENCOUNTER — Encounter (HOSPITAL_COMMUNITY): Payer: Self-pay

## 2015-09-12 ENCOUNTER — Emergency Department (HOSPITAL_COMMUNITY)
Admission: EM | Admit: 2015-09-12 | Discharge: 2015-09-12 | Disposition: A | Discharge status: Home / Self Care | Payer: MEDICAID | Attending: Emergency Medicine | Admitting: Emergency Medicine

## 2015-09-12 DIAGNOSIS — K0889 Other specified disorders of teeth and supporting structures: Secondary | ICD-10-CM

## 2015-09-12 MED ORDER — METOCLOPRAMIDE HCL 5 MG/ML IJ SOLN
10.0000 mg | Freq: Once | INTRAMUSCULAR | Status: DC
  Filled 2015-09-12: qty 2

## 2015-09-12 MED ORDER — ONDANSETRON HCL 4 MG/2ML IJ SOLN
4.0000 mg | Freq: Once | INTRAMUSCULAR | Status: DC
  Filled 2015-09-12: qty 2

## 2015-09-12 MED ORDER — DIPHENHYDRAMINE HCL 50 MG/ML IJ SOLN
25.0000 mg | Freq: Once | INTRAMUSCULAR | Status: DC
  Filled 2015-09-12: qty 1

## 2015-09-12 MED ORDER — HYDROCODONE-ACETAMINOPHEN 7.5-325 MG/15ML PO SOLN
10.0000 mL | Freq: Once | ORAL | Status: AC
  Administered 2015-09-12: 10 mL via ORAL
  Filled 2015-09-12: qty 15

## 2015-09-12 MED ORDER — AMOXICILLIN 400 MG/5ML PO SUSR: 500.0000 mg | Freq: Three times a day (TID) | ORAL | Status: AC

## 2015-09-12 MED ORDER — ACETAMINOPHEN-CODEINE 120-12 MG/5ML PO SOLN: 5.0000 mL | ORAL | Status: DC | PRN

## 2015-09-12 MED ORDER — BUPIVACAINE-EPINEPHRINE (PF) 0.5% -1:200000 IJ SOLN
1.8000 mL | Freq: Once | INTRAMUSCULAR | Status: AC
  Administered 2015-09-12: 1.8 mL
  Filled 2015-09-12: qty 1.8

## 2015-09-12 MED ORDER — AMOXICILLIN 250 MG/5ML PO SUSR
500.0000 mg | Freq: Once | ORAL | Status: AC
  Administered 2015-09-12: 500 mg via ORAL
  Filled 2015-09-12: qty 10

## 2015-09-12 MED ORDER — SODIUM CHLORIDE 0.9 % IV BOLUS (SEPSIS): 1000.0000 mL | Freq: Once | INTRAVENOUS | Status: DC

## 2015-09-12 NOTE — ED Notes (Signed)
Pt complains of a migraine for three days and a lower toothache

## 2015-09-12 NOTE — Discharge Instructions (Signed)

## 2015-09-12 NOTE — ED Provider Notes (Signed)
CSN: [id number]     Arrival date & time 09/11/15  2347 History   First MD Initiated Contact with Patient 09/12/15 [phone]     Chief Complaint  Patient presents with  . Migraine     (Consider location/radiation/quality/duration/timing/severity/associated sxs/prior Treatment) HPI   The patient has a past medical history shingles, migraines and asthma. She presents to the emergency department with complaints of toothache to her left lower tooth that is causing her to have headache. The needle at home to drain out some fluid but nothing would come out. She has a broken tooth and denies having any sort of dental insurance. She has a mild amount of swelling. She was see on 6/27 and was given a shot of abx and Ibuprofen for pain. She comes to the ED now because she has developed headache and worsened swelling.  .No difficulty breathing, shortness of breath, fever/chills.   Past Medical History  Diagnosis Date  . Shingles   . Migraines   . Asthma    Past Surgical History  Procedure Laterality Date  . Arm surgery     History reviewed. No pertinent family history. Social History  Substance Use Topics  . Smoking status: Never Smoker   . Smokeless tobacco: None  . Alcohol Use: No     Comment: "once a month"   OB History    No data available     Review of Systems  Review of Systems All other systems negative except as documented in the HPI. All pertinent positives and negatives as reviewed in the HPI.   Allergies  Review of patient's allergies indicates no known allergies.  Home Medications   Prior to Admission medications   Medication Sig Start Date End Date Taking? Authorizing Provider  ibuprofen (ADVIL,MOTRIN) 200 MG tablet Take 400 mg by mouth every 6 (six) hours as needed for headache, mild pain or moderate pain.    Yes Historical Provider, MD  naproxen sodium (ANAPROX) 220 MG tablet Take 440 mg by mouth every 12 (twelve) hours as needed (pain).    Yes Historical Provider, MD    acetaminophen-codeine 120-12 MG/5ML solution Take 5 mLs by mouth every 4 (four) hours as needed for moderate pain. 09/12/15   Isaak Delmundo Carlota Raspberry, PA-C  albuterol (PROVENTIL HFA;VENTOLIN HFA) 108 (90 BASE) MCG/ACT inhaler Inhale 1-2 puffs into the lungs every 6 (six) hours as needed for wheezing or shortness of breath. Patient not taking: Reported on 09/12/2015 03/07/15   Olivia Canter Sam, PA-C  amoxicillin (AMOXIL) 400 MG/5ML suspension Take 6.3 mLs (500 mg total) by mouth 3 (three) times daily. 09/12/15 09/19/15  Delos Haring, PA-C  amoxicillin (AMOXIL) 500 MG capsule Take 1 capsule (500 mg total) by mouth 3 (three) times daily. Patient not taking: Reported on 09/12/2015 08/17/15   Domenic Moras, PA-C  benzonatate (TESSALON) 100 MG capsule Take 1 capsule (100 mg total) by mouth every 8 (eight) hours. Patient not taking: Reported on 09/12/2015 08/17/15   Domenic Moras, PA-C  cetirizine (ZYRTEC ALLERGY) 10 MG tablet Take 1 tablet (10 mg total) by mouth daily. Patient not taking: Reported on 09/12/2015 07/09/15   Montine Circle, PA-C  guaiFENesin (ROBITUSSIN) 100 MG/5ML liquid Take 5-10 mLs (100-200 mg total) by mouth every 4 (four) hours as needed for cough. Patient not taking: Reported on 09/12/2015 08/17/15   Domenic Moras, PA-C  ibuprofen (ADVIL,MOTRIN) 800 MG tablet Take 1 tablet (800 mg total) by mouth 3 (three) times daily. Patient not taking: Reported on 09/12/2015 09/11/15   Ozella Almond  Ward, PA-C  penicillin v potassium (VEETID) 500 MG tablet Take 1 tablet (500 mg total) by mouth 4 (four) times daily. Patient not taking: Reported on 09/12/2015 09/11/15 09/18/15  Ozella Almond Ward, PA-C   BP 137/101 mmHg  Pulse 87  Temp(Src) 98.7 F (37.1 C) (Oral)  Resp 16  SpO2 100%  LMP 08/15/2015 Physical Exam  Constitutional: She appears well-developed and well-nourished. No distress.  HENT:  Head: Normocephalic and atraumatic.  Mouth/Throat: Uvula is midline, oropharynx is clear and moist and mucous membranes are normal.  No trismus in the jaw. Normal dentition. Dental caries (Pts tooth shows no obvious abscess but moderate to severe tenderness to palpation of marked tooth) present. No uvula swelling.    No obvious abscess noted. No ludwig angina symptoms. Pt tolerating secretions. She does have swelling to the left cheek and inflammation of the gumline.  Eyes: Pupils are equal, round, and reactive to light.  Neck: Trachea normal, normal range of motion and full passive range of motion without pain. Neck supple.  Cardiovascular: Normal rate, regular rhythm, normal heart sounds and normal pulses.   Pulmonary/Chest: Effort normal and breath sounds normal. No respiratory distress. Chest wall is not dull to percussion. She exhibits no tenderness, no crepitus, no edema, no deformity and no retraction.  Abdominal: Normal appearance.  Musculoskeletal: Normal range of motion.  Neurological: She is alert. She has normal strength.  Skin: Skin is warm, dry and intact. She is not diaphoretic.  Psychiatric: She has a normal mood and affect. Her speech is normal. Cognition and memory are normal.    ED Course  Procedures (including critical care time) Labs Review Labs Reviewed - No data to display  Imaging Review No results found. I have personally reviewed and evaluated these images and lab results as part of my medical decision-making.   EKG Interpretation None      MDM   Final diagnoses:  Toothache   Pt reports she is having difficulty with secretions but she is not drooling and drinks the liquid pain medication and antibiotic without any difficulty.  DENTAL NERVE BLOCK Date/Time: 2: 15 am 6/28 Performed by: Linus Mako Authorized by: Linus Mako Consent: Verbal consent obtained. Risks and benefits: risks, benefits and alternatives were discussed Consent given by: patient Indications: pain relief Body area: face/mouth Laterality: left lower molar Needle gauge: 25 G Local anesthetic:  lidocaine 2% without epinephrine Anesthetic total: 2 ml Outcome: pain improved Patient tolerance: Patient tolerated the procedure well with no immediate complications. Comments: Patient had complete relief of pain.  Medications  bupivacaine-epinephrine (MARCAINE W/ EPI) 0.5% -1:200000 injection 1.8 mL (1.8 mLs Infiltration Given by Other 09/12/15 0127)  amoxicillin (AMOXIL) 250 MG/5ML suspension 500 mg (500 mg Oral Given 09/12/15 0142)  HYDROcodone-acetaminophen (HYCET) 7.5-325 mg/15 ml solution 10 mL (10 mLs Oral Given 09/12/15 0127)    35 y.o.Rhia Slusher's evaluation in the Emergency Department is complete.  We have discussed signs and symptoms that warrant return to the ED, such as changes or worsening in symptoms. No emergent s/sx's present. Patent airway. No trismus.  No neck tenderness or protrusion of tongue or floor of mouth. Patient will be given an rx for Amoxicillin and tylenol w/codeine. He will be referred to a dentist with instructions for follow-up.  Vital signs are stable at discharge. Filed Vitals:   09/12/15 0007  BP: 137/101  Pulse: 87  Temp: 98.7 F (37.1 C)  Resp: 16    Patient/guardian has voiced understanding and agreed to  follow-up with the PCP or specialist.         Delos Haring, PA-C 09/12/15 [place], DO 09/12/15 [id number]

## 2015-10-12 ENCOUNTER — Encounter (HOSPITAL_COMMUNITY): Payer: Self-pay | Admitting: Emergency Medicine

## 2015-10-12 ENCOUNTER — Emergency Department (HOSPITAL_COMMUNITY)
Admission: EM | Admit: 2015-10-12 | Discharge: 2015-10-12 | Disposition: A | Discharge status: Home / Self Care | Payer: Self-pay | Attending: Emergency Medicine | Admitting: Emergency Medicine

## 2015-10-12 DIAGNOSIS — Y9289 Other specified places as the place of occurrence of the external cause: Secondary | ICD-10-CM | POA: Insufficient documentation

## 2015-10-12 DIAGNOSIS — W268XXA Contact with other sharp object(s), not elsewhere classified, initial encounter: Secondary | ICD-10-CM | POA: Insufficient documentation

## 2015-10-12 DIAGNOSIS — Y999 Unspecified external cause status: Secondary | ICD-10-CM | POA: Insufficient documentation

## 2015-10-12 DIAGNOSIS — Z5321 Procedure and treatment not carried out due to patient leaving prior to being seen by health care provider: Secondary | ICD-10-CM | POA: Insufficient documentation

## 2015-10-12 DIAGNOSIS — S61211A Laceration without foreign body of left index finger without damage to nail, initial encounter: Secondary | ICD-10-CM | POA: Insufficient documentation

## 2015-10-12 DIAGNOSIS — Y939 Activity, unspecified: Secondary | ICD-10-CM | POA: Insufficient documentation

## 2015-10-12 NOTE — ED Triage Notes (Signed)
Pt. accidentally cut her left distal index finger with a lettuce cutter at work this evening and her left nare with a razor this evening .

## 2015-10-12 NOTE — ED Notes (Signed)
Pt did not want to be seen by a student or PA, nor did she want to wait.  RN informed PA.  RN expressed importance of going to the urgent care to get tetanus shot tomorrow.

## 2015-10-12 NOTE — ED Provider Notes (Signed)
Patient left without being seen. She reported being tired and not wanting to see a PA or a student and that she was going to leave. Recommended patient go to UC for tetanus shot tomorrow if she is out of date.  Pt LWBS after triage.   Delos Haring, PA-C [id number]    Delora Fuel, MD [id number] [id number]

## 2015-10-30 ENCOUNTER — Encounter (HOSPITAL_COMMUNITY): Payer: Self-pay | Admitting: Emergency Medicine

## 2015-10-30 ENCOUNTER — Emergency Department (HOSPITAL_COMMUNITY)
Admission: EM | Admit: 2015-10-30 | Discharge: 2015-10-30 | Disposition: A | Discharge status: Home / Self Care | Payer: Self-pay | Attending: Physician Assistant | Admitting: Physician Assistant

## 2015-10-30 DIAGNOSIS — K529 Noninfective gastroenteritis and colitis, unspecified: Secondary | ICD-10-CM | POA: Insufficient documentation

## 2015-10-30 DIAGNOSIS — Z79899 Other long term (current) drug therapy: Secondary | ICD-10-CM | POA: Insufficient documentation

## 2015-10-30 DIAGNOSIS — J45909 Unspecified asthma, uncomplicated: Secondary | ICD-10-CM | POA: Insufficient documentation

## 2015-10-30 LAB — URINALYSIS, ROUTINE W REFLEX MICROSCOPIC
BILIRUBIN URINE: NEGATIVE
GLUCOSE, UA: NEGATIVE mg/dL
KETONES UR: NEGATIVE mg/dL
NITRITE: NEGATIVE
PH: 6 (ref 5.0–8.0)
Protein, ur: NEGATIVE mg/dL
SPECIFIC GRAVITY, URINE: 1.021 (ref 1.005–1.030)

## 2015-10-30 LAB — URINE MICROSCOPIC-ADD ON

## 2015-10-30 LAB — POC URINE PREG, ED: Preg Test, Ur: NEGATIVE

## 2015-10-30 MED ORDER — ONDANSETRON HCL 4 MG PO TABS: 4.0000 mg | ORAL_TABLET | Freq: Three times a day (TID) | ORAL | 0 refills | Status: DC | PRN

## 2015-10-30 MED ORDER — ONDANSETRON HCL 4 MG PO TABS
4.0000 mg | ORAL_TABLET | Freq: Once | ORAL | Status: AC
  Administered 2015-10-30: 4 mg via ORAL
  Filled 2015-10-30: qty 1

## 2015-10-30 NOTE — ED Triage Notes (Signed)
Pt from home with c/o N/V/D for a few days.  Pt reports 6 episodes of emesis and 3 episodes of diarrhea in the last 24 hours.  Reports she wanted to be seen before returning to work.  NAD, A&O.

## 2015-10-30 NOTE — ED Provider Notes (Signed)
[facility] DEPT Provider Note   CSN: [id number] Arrival date & time: 10/30/15  0905     History   Chief Complaint No chief complaint on file.   HPI Marcia Pham is a 35 y.o. female.  The history is provided by the patient.  Emesis   This is a new problem. The current episode started 2 days ago. The problem occurs 2 to 4 times per day. The problem has been gradually improving. The emesis has an appearance of stomach contents. There has been no fever. Associated symptoms include diarrhea. Pertinent negatives include no abdominal pain, no cough and no myalgias.    Past Medical History:  Diagnosis Date  . Asthma   . Migraines   . Shingles     There are no active problems to display for this patient.   Past Surgical History:  Procedure Laterality Date  . ARM SURGERY      OB History    No data available       Home Medications    Prior to Admission medications   Medication Sig Start Date End Date Taking? Authorizing Provider  acetaminophen-codeine 120-12 MG/5ML solution Take 5 mLs by mouth every 4 (four) hours as needed for moderate pain. Patient not taking: Reported on 10/12/2015 09/12/15   Delos Haring, PA-C  albuterol (PROVENTIL HFA;VENTOLIN HFA) 108 (90 BASE) MCG/ACT inhaler Inhale 1-2 puffs into the lungs every 6 (six) hours as needed for wheezing or shortness of breath. Patient not taking: Reported on 09/12/2015 03/07/15   Olivia Canter Sam, PA-C  amoxicillin (AMOXIL) 500 MG capsule Take 1 capsule (500 mg total) by mouth 3 (three) times daily. Patient not taking: Reported on 09/12/2015 08/17/15   Domenic Moras, PA-C  benzonatate (TESSALON) 100 MG capsule Take 1 capsule (100 mg total) by mouth every 8 (eight) hours. Patient not taking: Reported on 09/12/2015 08/17/15   Domenic Moras, PA-C  cetirizine (ZYRTEC ALLERGY) 10 MG tablet Take 1 tablet (10 mg total) by mouth daily. Patient not taking: Reported on 09/12/2015 07/09/15   Montine Circle, PA-C  guaiFENesin (ROBITUSSIN) 100  MG/5ML liquid Take 5-10 mLs (100-200 mg total) by mouth every 4 (four) hours as needed for cough. Patient not taking: Reported on 09/12/2015 08/17/15   Domenic Moras, PA-C  ibuprofen (ADVIL,MOTRIN) 800 MG tablet Take 1 tablet (800 mg total) by mouth 3 (three) times daily. Patient not taking: Reported on 09/12/2015 09/11/15   Ozella Almond Ward, PA-C    Family History No family history on file.  Social History Social History  Substance Use Topics  . Smoking status: Never Smoker  . Smokeless tobacco: Not on file  . Alcohol use No     Comment: "once a month"     Allergies   Review of patient's allergies indicates no known allergies.   Review of Systems Review of Systems  Constitutional: Negative for activity change.  Respiratory: Negative for cough and shortness of breath.   Cardiovascular: Negative for chest pain.  Gastrointestinal: Positive for diarrhea and vomiting. Negative for abdominal pain.  Musculoskeletal: Negative for myalgias.  All other systems reviewed and are negative.    Physical Exam Updated Vital Signs BP 121/69 (BP Location: Right Arm)   Pulse 69   Temp 98.3 F (36.8 C) (Oral)   Resp 18   Ht 5' (1.524 m)   Wt 153 lb (69.4 kg)   LMP 09/19/2015 (Approximate)   SpO2 100%   BMI 29.88 kg/m   Physical Exam  Constitutional: She is oriented to person,  place, and time. She appears well-developed and well-nourished.  HENT:  Head: Normocephalic and atraumatic.  Eyes: Right eye exhibits no discharge.  Cardiovascular: Normal rate.   Pulmonary/Chest: Effort normal.  Abdominal: Soft. There is no tenderness.  Neurological: She is oriented to person, place, and time.  Skin: Skin is warm and dry. She is not diaphoretic.  Psychiatric: She has a normal mood and affect.  Nursing note and vitals reviewed.    ED Treatments / Results  Labs (all labs ordered are listed, but only abnormal results are displayed) Labs Reviewed  URINE CULTURE  URINALYSIS, ROUTINE W  REFLEX MICROSCOPIC (NOT AT [facility])  PREGNANCY, URINE    EKG  EKG Interpretation None       Radiology No results found.  Procedures Procedures (including critical care time)  Medications Ordered in ED Medications  ondansetron (ZOFRAN) tablet 4 mg (not administered)     Initial Impression / Assessment and Plan / ED Course  I have reviewed the triage vital signs and the nursing notes.  Pertinent labs & imaging results that were available during my care of the patient were reviewed by me and considered in my medical decision making (see chart for details).  Clinical Course   Patient is a 35 year old female presenting with nausea vomiting diarrhea for 2 days. Patient works at [employer]. She is here for work note. Patient appears very well normal vital signs. No abdominal pain. Suspect viral GI bug. we will check for pregnancy, UA. We will send with Zofran and work note.  Patient is comfortable, ambulatory, and taking PO at time of discharge.  Patient expressed understanding about return precautions.     Final Clinical Impressions(s) / ED Diagnoses   Final diagnoses:  None    New Prescriptions New Prescriptions   No medications on file     Marcia Halley Julio Alm, MD 10/30/15 0930

## 2015-10-30 NOTE — Discharge Instructions (Signed)
Please return with ambdominal pain, fever or other concerns.

## 2015-10-31 LAB — URINE CULTURE

## 2015-12-22 IMAGING — US US ART/VEN ABD/PELV/SCROTUM DOPPLER LTD
1 series · 13 of 25 positions shown · non-contrast
Comparison: None.

CLINICAL DATA: Pelvic pain for 2 hours. Rule out torsion,
tubo-ovarian abscess or ovarian cyst.

EXAM:
TRANSABDOMINAL AND TRANSVAGINAL ULTRASOUND OF PELVIS
DOPPLER ULTRASOUND OF OVARIES
TECHNIQUE: Both transabdominal and transvaginal ultrasound examinations of the
pelvis were performed. Transabdominal technique was performed for
global imaging of the pelvis including uterus, ovaries, adnexal
regions, and pelvic cul-de-sac.
It was necessary to proceed with endovaginal exam following the
transabdominal exam to visualize the left and right ovary. Color and
duplex Doppler ultrasound was utilized to evaluate blood flow to the
ovaries.

[Series 1: us art/ven abd/pelv/scrotum doppler ltd · 0.17mm/px · 63 acquisitions, 13 frames shown]
[im 1/63]
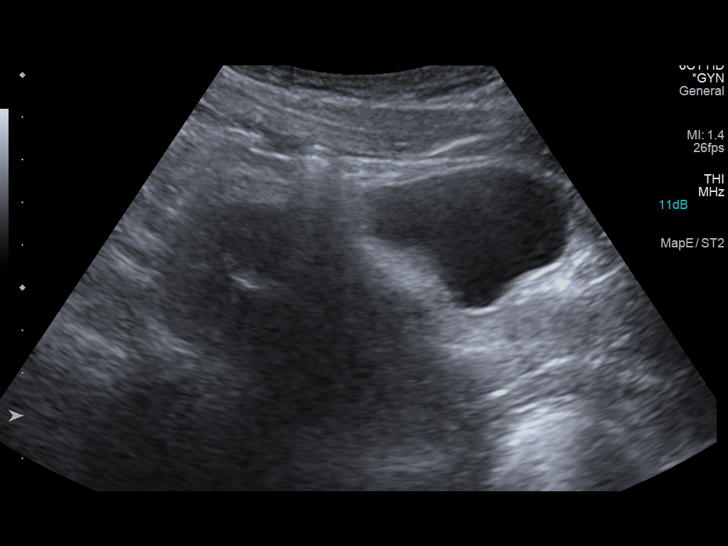
[im 6/63]
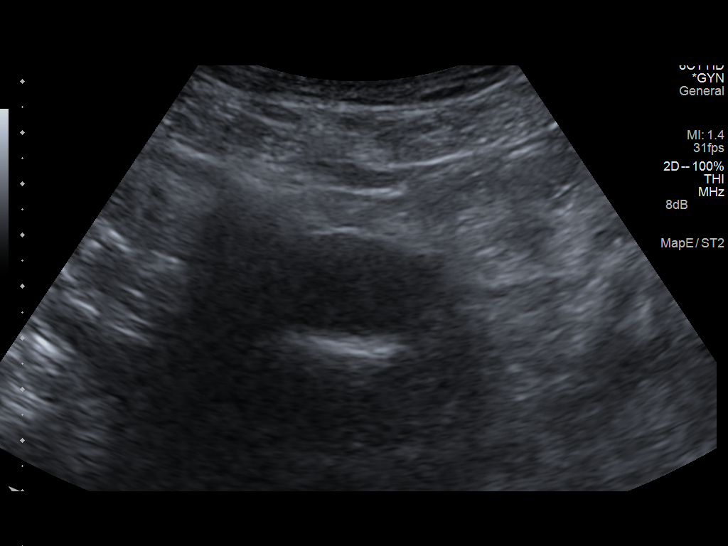
[im 11/63]
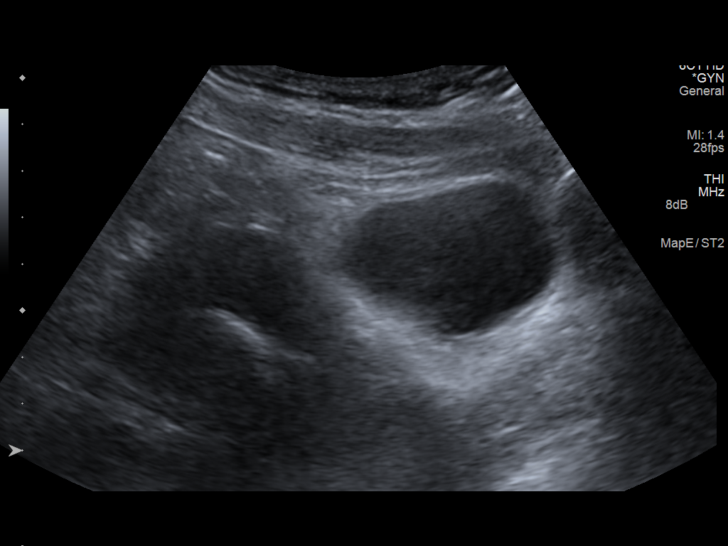
[im 16/63]
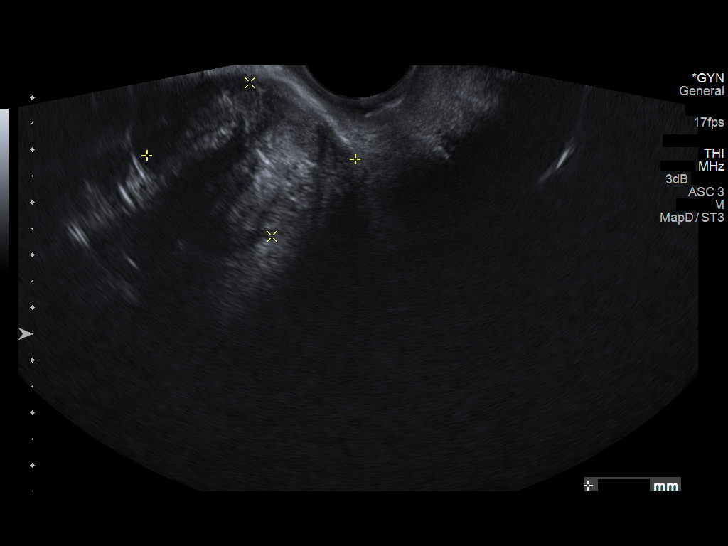
[im 21/63]
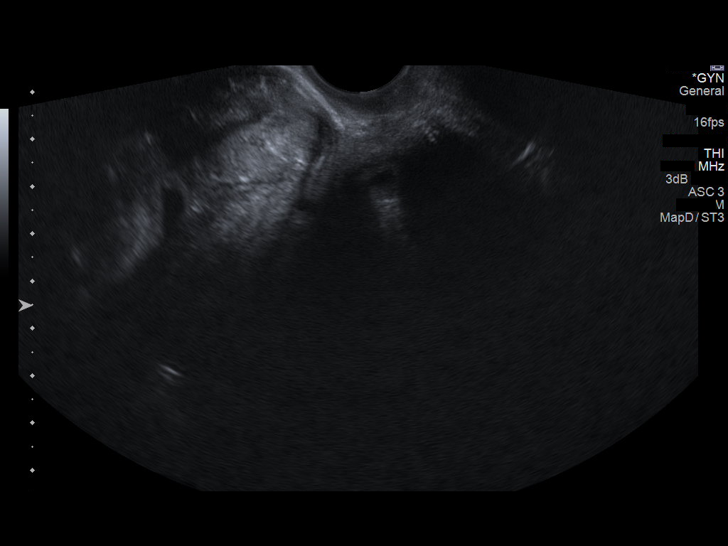
[im 26/63]
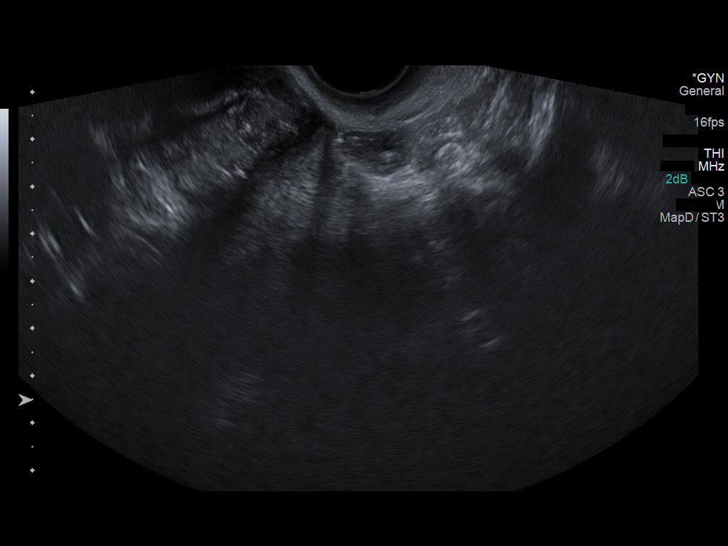
[im 32/63]
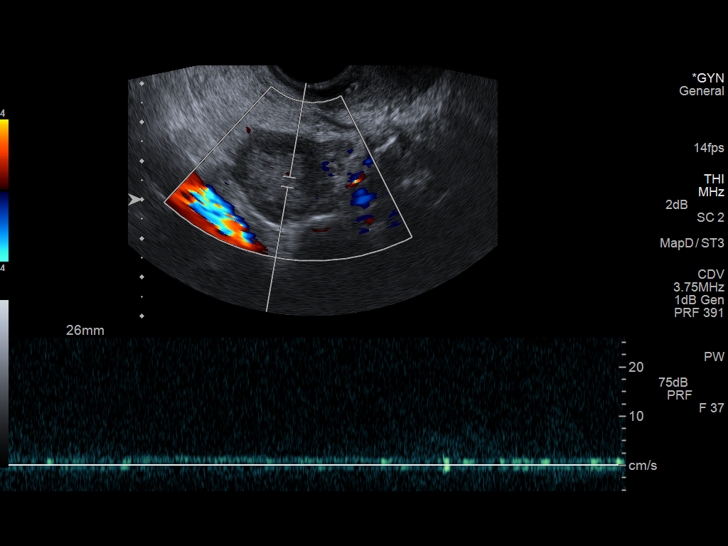
[im 37/63]
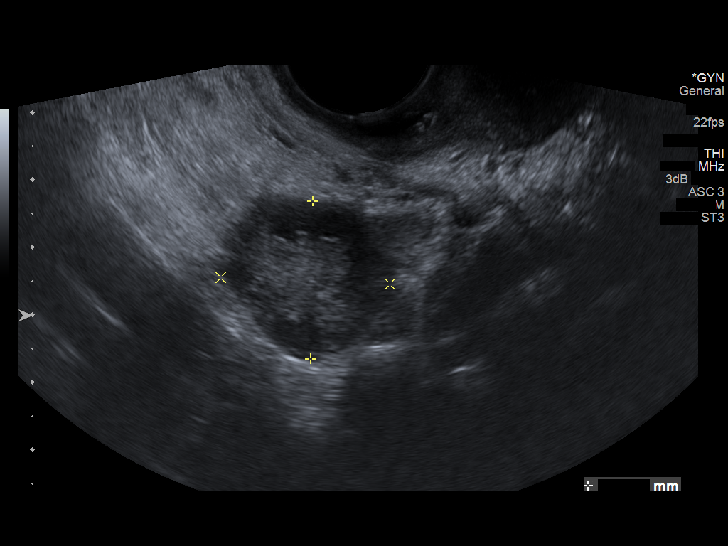
[im 42/63]
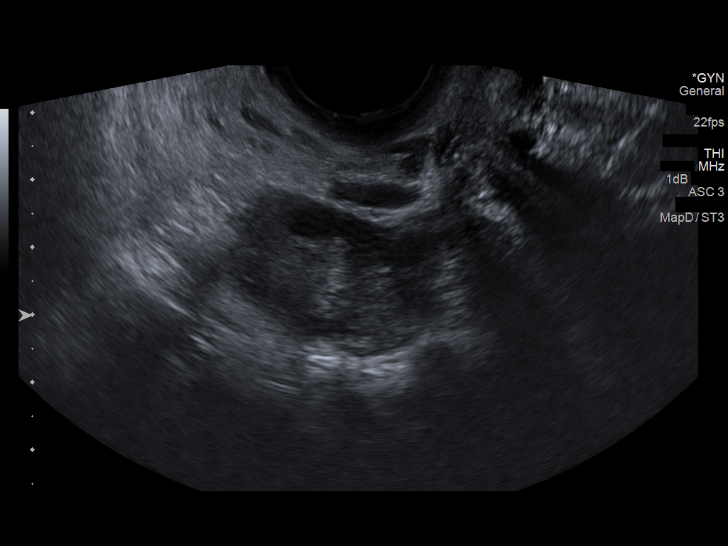
[im 47/63]
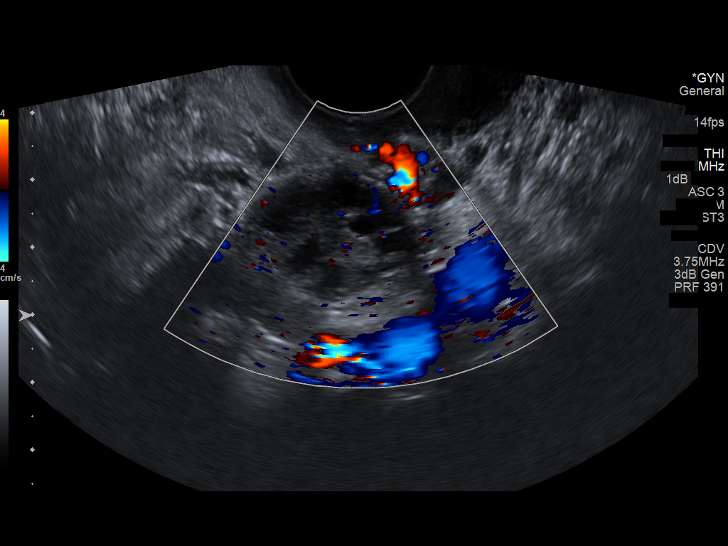
[im 52/63]
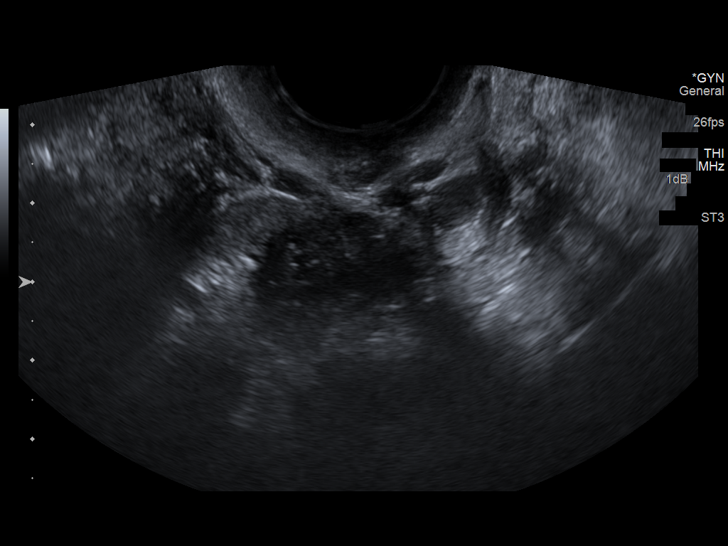
[im 57/63]
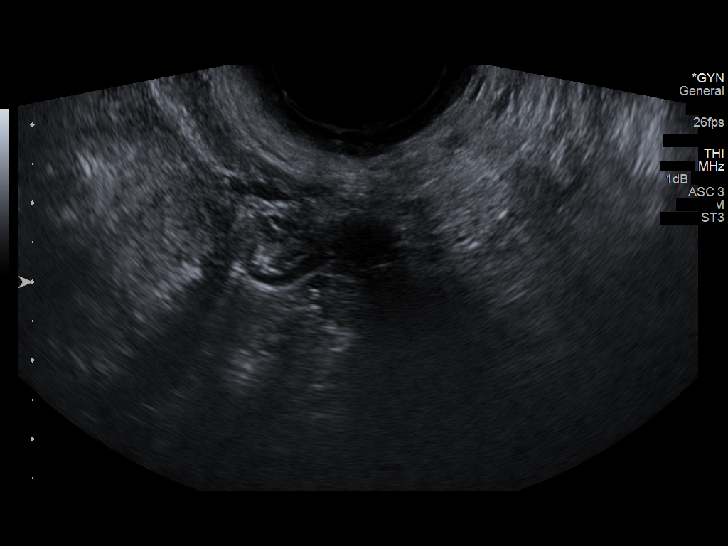
[im 63/63]
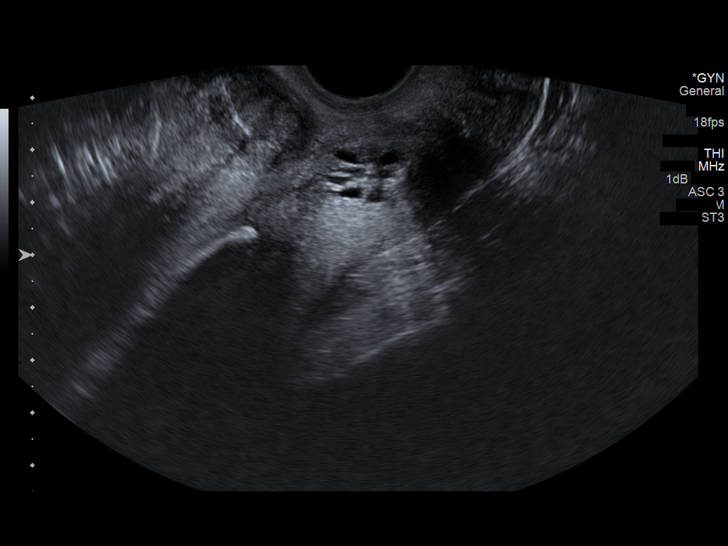

[13 of 25 positions shown; findings below may reference images not displayed]

FINDINGS: Uterus

Measurements: 10.2 x 4.4 x 5.7 cm. There is a pedunculated fibroid
in the anterior lower uterine segment measuring at least 3.8 x 4.0 x
3.0 cm on transvaginal imaging. Small nabothian cysts in the cervix.

Endometrium

Thickness: 2.7 mm.  No focal abnormality visualized.

Right ovary

Measurements: 2.4 x 2.5 x 2.9 cm. Normal appearance/no adnexal mass.
There is normal blood flow.

Left ovary

Measurements: 2.5 x 1.9 x 2.5 cm. Normal appearance/no adnexal mass.
There is normal blood flow.

Pulsed Doppler evaluation of both ovaries demonstrates normal
low-resistance arterial and venous waveforms.

Other findings

Trace free fluid.
IMPRESSION: 1. Normal sonographic appearance of both ovaries without torsion.
2. Pedunculated uterine fibroid measuring up to 4 cm arising from
the anterior lower uterine segment.

## 2016-03-05 ENCOUNTER — Emergency Department (HOSPITAL_COMMUNITY): Payer: Self-pay

## 2016-03-05 ENCOUNTER — Emergency Department (HOSPITAL_COMMUNITY)
Admission: EM | Admit: 2016-03-05 | Discharge: 2016-03-05 | Disposition: A | Discharge status: Home / Self Care | Payer: Self-pay | Attending: Emergency Medicine | Admitting: Emergency Medicine

## 2016-03-05 ENCOUNTER — Encounter (HOSPITAL_COMMUNITY): Payer: Self-pay | Admitting: Neurology

## 2016-03-05 DIAGNOSIS — J45909 Unspecified asthma, uncomplicated: Secondary | ICD-10-CM | POA: Insufficient documentation

## 2016-03-05 DIAGNOSIS — S46911A Strain of unspecified muscle, fascia and tendon at shoulder and upper arm level, right arm, initial encounter: Secondary | ICD-10-CM | POA: Insufficient documentation

## 2016-03-05 DIAGNOSIS — J069 Acute upper respiratory infection, unspecified: Secondary | ICD-10-CM | POA: Insufficient documentation

## 2016-03-05 DIAGNOSIS — Y939 Activity, unspecified: Secondary | ICD-10-CM | POA: Insufficient documentation

## 2016-03-05 DIAGNOSIS — Y999 Unspecified external cause status: Secondary | ICD-10-CM | POA: Insufficient documentation

## 2016-03-05 DIAGNOSIS — X58XXXA Exposure to other specified factors, initial encounter: Secondary | ICD-10-CM | POA: Insufficient documentation

## 2016-03-05 DIAGNOSIS — T148XXA Other injury of unspecified body region, initial encounter: Secondary | ICD-10-CM

## 2016-03-05 DIAGNOSIS — Y929 Unspecified place or not applicable: Secondary | ICD-10-CM | POA: Insufficient documentation

## 2016-03-05 MED ORDER — GUAIFENESIN-CODEINE 100-10 MG/5ML PO SYRP: 5.0000 mL | ORAL_SOLUTION | Freq: Every evening | ORAL | 0 refills | Status: DC | PRN

## 2016-03-05 MED ORDER — LIDOCAINE 5 % EX PTCH
1.0000 | MEDICATED_PATCH | Freq: Once | CUTANEOUS | Status: DC
  Administered 2016-03-05: 1 via TRANSDERMAL
  Filled 2016-03-05: qty 1

## 2016-03-05 MED ORDER — BENZONATATE 100 MG PO CAPS: 100.0000 mg | ORAL_CAPSULE | Freq: Three times a day (TID) | ORAL | 0 refills | Status: DC | PRN

## 2016-03-05 NOTE — ED Triage Notes (Signed)
Pt reports right shoulder pain for weeks when lifting, denies injury. Also, cough and hoarseness x 5 days. Has spent over $ 100 on OTC meds without relief. Denies fever .

## 2016-03-05 NOTE — Discharge Instructions (Signed)
1. Medications: tessalon for cough during the day, cough syrup only as needed for cough at night, ibuprofen as needed for muscle aches, continue usual home medications 2. Treatment: rest, drink plenty of fluids 3. Follow Up: Please follow up with your primary doctor for discussion of your diagnoses and further evaluation after today's visit if symptoms persist longer than 14 days; Return to the ER for high fevers, difficulty breathing or other concerning symptoms

## 2016-03-05 NOTE — ED Provider Notes (Signed)
[facility] DEPT Provider Note   CSN: [id number] Arrival date & time: 03/05/16  1013  By signing my name below, I, Emmanuella Mensah, attest that this documentation has been prepared under the direction and in the presence of [facility], PA-C. Electronically Signed: Judithann Sauger, ED Scribe. 03/05/16. 11:51 AM.   History   Chief Complaint Chief Complaint  Patient presents with  . Cough  . Shoulder Pain    HPI Comments: Marcia Pham is a 35 y.o. female who presents to the Emergency Department complaining of sudden onset, persistent moderate non-productive cough onset one week ago. She reports associated voice hoarseness, scratchy throat, and generalized body aches. She notes that she also has nasal congestion which is worse at night. She explains that she has black mold at her house and believes that it is contributing to her symptoms. She denies any sick contacts; pt lives at home by herself. No alleviating factors noted. She states that she has tried OTC Robutussin, Nyquil, and Mucinex with no relief. She has NKDA. She also denies any fever, chills, nausea, vomiting, shortness of breath, or generalized rash.  She also c/o gradually worsening right shoulder pain onset several weeks ago. Pt is right hand dominate. She denies any recent falls, injuries, or trauma. She states that she has tried ibuprofen with no relief. She denies any recent prednisone use or a hx of DM. She also denies any neck pain, numbness/tingling, weakness, or open wounds. No other complaints at this time.   The history is provided by the patient. No language interpreter was used.    Past Medical History:  Diagnosis Date  . Asthma   . Migraines   . Shingles     There are no active problems to display for this patient.   Past Surgical History:  Procedure Laterality Date  . ARM SURGERY      OB History    No data available       Home Medications    Prior to Admission medications   Medication  Sig Start Date End Date Taking? Authorizing Provider  acetaminophen-codeine 120-12 MG/5ML solution Take 5 mLs by mouth every 4 (four) hours as needed for moderate pain. Patient not taking: Reported on 10/12/2015 09/12/15   Delos Haring, PA-C  albuterol (PROVENTIL HFA;VENTOLIN HFA) 108 (90 BASE) MCG/ACT inhaler Inhale 1-2 puffs into the lungs every 6 (six) hours as needed for wheezing or shortness of breath. 03/07/15   Olivia Canter Sam, PA-C  amoxicillin (AMOXIL) 500 MG capsule Take 1 capsule (500 mg total) by mouth 3 (three) times daily. Patient not taking: Reported on 09/12/2015 08/17/15   Domenic Moras, PA-C  benzonatate (TESSALON) 100 MG capsule Take 1 capsule (100 mg total) by mouth 3 (three) times daily as needed for cough. 03/05/16   Ozella Almond Luwana Butrick, PA-C  cetirizine (ZYRTEC ALLERGY) 10 MG tablet Take 1 tablet (10 mg total) by mouth daily. Patient not taking: Reported on 09/12/2015 07/09/15   Montine Circle, PA-C  guaiFENesin-codeine [facility]) 100-10 MG/5ML syrup Take 5 mLs by mouth at bedtime as needed for cough. 03/05/16   Ozella Almond Hansen Carino, PA-C  ibuprofen (ADVIL,MOTRIN) 800 MG tablet Take 1 tablet (800 mg total) by mouth 3 (three) times daily. Patient not taking: Reported on 09/12/2015 09/11/15   [facility] Chui, PA-C  ondansetron (ZOFRAN) 4 MG tablet Take 1 tablet (4 mg total) by mouth every 8 (eight) hours as needed for nausea or vomiting. 10/30/15   Courteney Julio Alm, MD    Family History  No family history on file.  Social History Social History  Substance Use Topics  . Smoking status: Never Smoker  . Smokeless tobacco: Never Used  . Alcohol use No     Comment: "once a month"     Allergies   Patient has no known allergies.   Review of Systems Review of Systems  Constitutional: Negative for chills and fever.  HENT: Positive for congestion, sore throat (scratchy) and voice change.   Respiratory: Positive for cough. Negative for shortness of breath.     Gastrointestinal: Negative for nausea and vomiting.  Musculoskeletal: Positive for arthralgias and myalgias. Negative for neck pain.  Skin: Negative for wound.  Neurological: Negative for weakness and numbness.     Physical Exam Updated Vital Signs BP 123/80 (BP Location: Right Arm)   Pulse 77   Temp 97.5 F (36.4 C) (Oral)   Resp 18   Ht 5' (1.524 m)   Wt 64.9 kg   LMP 03/05/2016   SpO2 100%   BMI 27.93 kg/m   Physical Exam  Constitutional: She is oriented to person, place, and time. She appears well-developed and well-nourished. No distress.  HENT:  Head: Normocephalic and atraumatic.  Mouth/Throat: Posterior oropharyngeal erythema present. No oropharyngeal exudate or posterior oropharyngeal edema.  No focal areas of sinus tenderness   Cardiovascular: Normal rate, regular rhythm and normal heart sounds.   No murmur heard. Pulmonary/Chest: Effort normal and breath sounds normal. No respiratory distress.  Musculoskeletal: She exhibits tenderness. She exhibits no edema.  TTP over posterior musculature of the right shoulder, negative empty can; no overlying erythema, ecchymosis, swelling No midline tenderness  Neurological: She is alert and oriented to person, place, and time.  Skin: Skin is warm and dry.  Nursing note and vitals reviewed.    ED Treatments / Results  DIAGNOSTIC STUDIES: Oxygen Saturation is 100% on RA, normal by my interpretation.    COORDINATION OF CARE: 11:46 AM- Pt advised of plan for treatment and pt agrees. Pt informed of her right shoulder x-ray results. She declines prednisone, stating that she has a hx of insomnia. Pt will receive Lidoderm patch, Tessalon, and guaifenesin with codeine for night time use.   Labs (all labs ordered are listed, but only abnormal results are displayed) Labs Reviewed - No data to display  EKG  EKG Interpretation None       Radiology Dg Shoulder Right  Result Date: 03/05/2016 CLINICAL DATA:  Pain EXAM:  RIGHT SHOULDER - 2+ VIEW COMPARISON:  None. FINDINGS: Frontal, Y scapular, and axillary images were obtained. There is no evident fracture or dislocation. The joint spaces appear normal. No erosive change. Visualized right lung is clear. There are surgical clips overlying the inferior, lateral right scapula. IMPRESSION: No fracture or dislocation.  No evident arthropathy. Electronically Signed   By: Lowella Grip III M.D.   On: 03/05/2016 11:19    Procedures Procedures (including critical care time)  Medications Ordered in ED Medications  lidocaine (LIDODERM) 5 % 1 patch (1 patch Transdermal Patch Applied 03/05/16 1156)     Initial Impression / Assessment and Plan / ED Course  Pearlie Oyster, PA-C has reviewed the triage vital signs and the nursing notes.  Pertinent labs & imaging results that were available during my care of the patient were reviewed by me and considered in my medical decision making (see chart for details).  Clinical Course    Marcia Pham is a 35 y.o. female who presents to ED for two complaints:  1. Cough, congestion, body aches c/w viral illness. She is afebrile, non-toxic appearing with a clear lung exam. Mild rhinorrhea and OP with erythema, no exudates. Likely viral URI. Patient is agreeable to symptomatic treatment. PCP follow up encouraged.   2. Right shoulder pain which appears musk in etiology. X-ray negative. Lidocaine patch provided in ED. Home care instructions discussed.   Reasons to return to ED discussed and all questions answered.    Final Clinical Impressions(s) / ED Diagnoses   Final diagnoses:  Muscle strain  Upper respiratory tract infection, unspecified type    New Prescriptions Discharge Medication List as of 03/05/2016 11:52 AM    START taking these medications   Details  guaiFENesin-codeine (ROBITUSSIN AC) 100-10 MG/5ML syrup Take 5 mLs by mouth at bedtime as needed for cough., Starting Wed 03/05/2016, Print       I personally  performed the services described in this documentation, which was scribed in my presence. The recorded information has been reviewed and is accurate.    [facility] Donnie Panik, PA-C 03/05/16 1212    Alfonzo Beers, MD 03/05/16 [phone]

## 2016-05-25 IMAGING — CR DG CHEST 2V
2 series · 2 of 2 positions shown · non-contrast
Comparison: 03/30/2013

CLINICAL DATA: Chest pain

EXAM:
CHEST  2 VIEW

[chest pa]
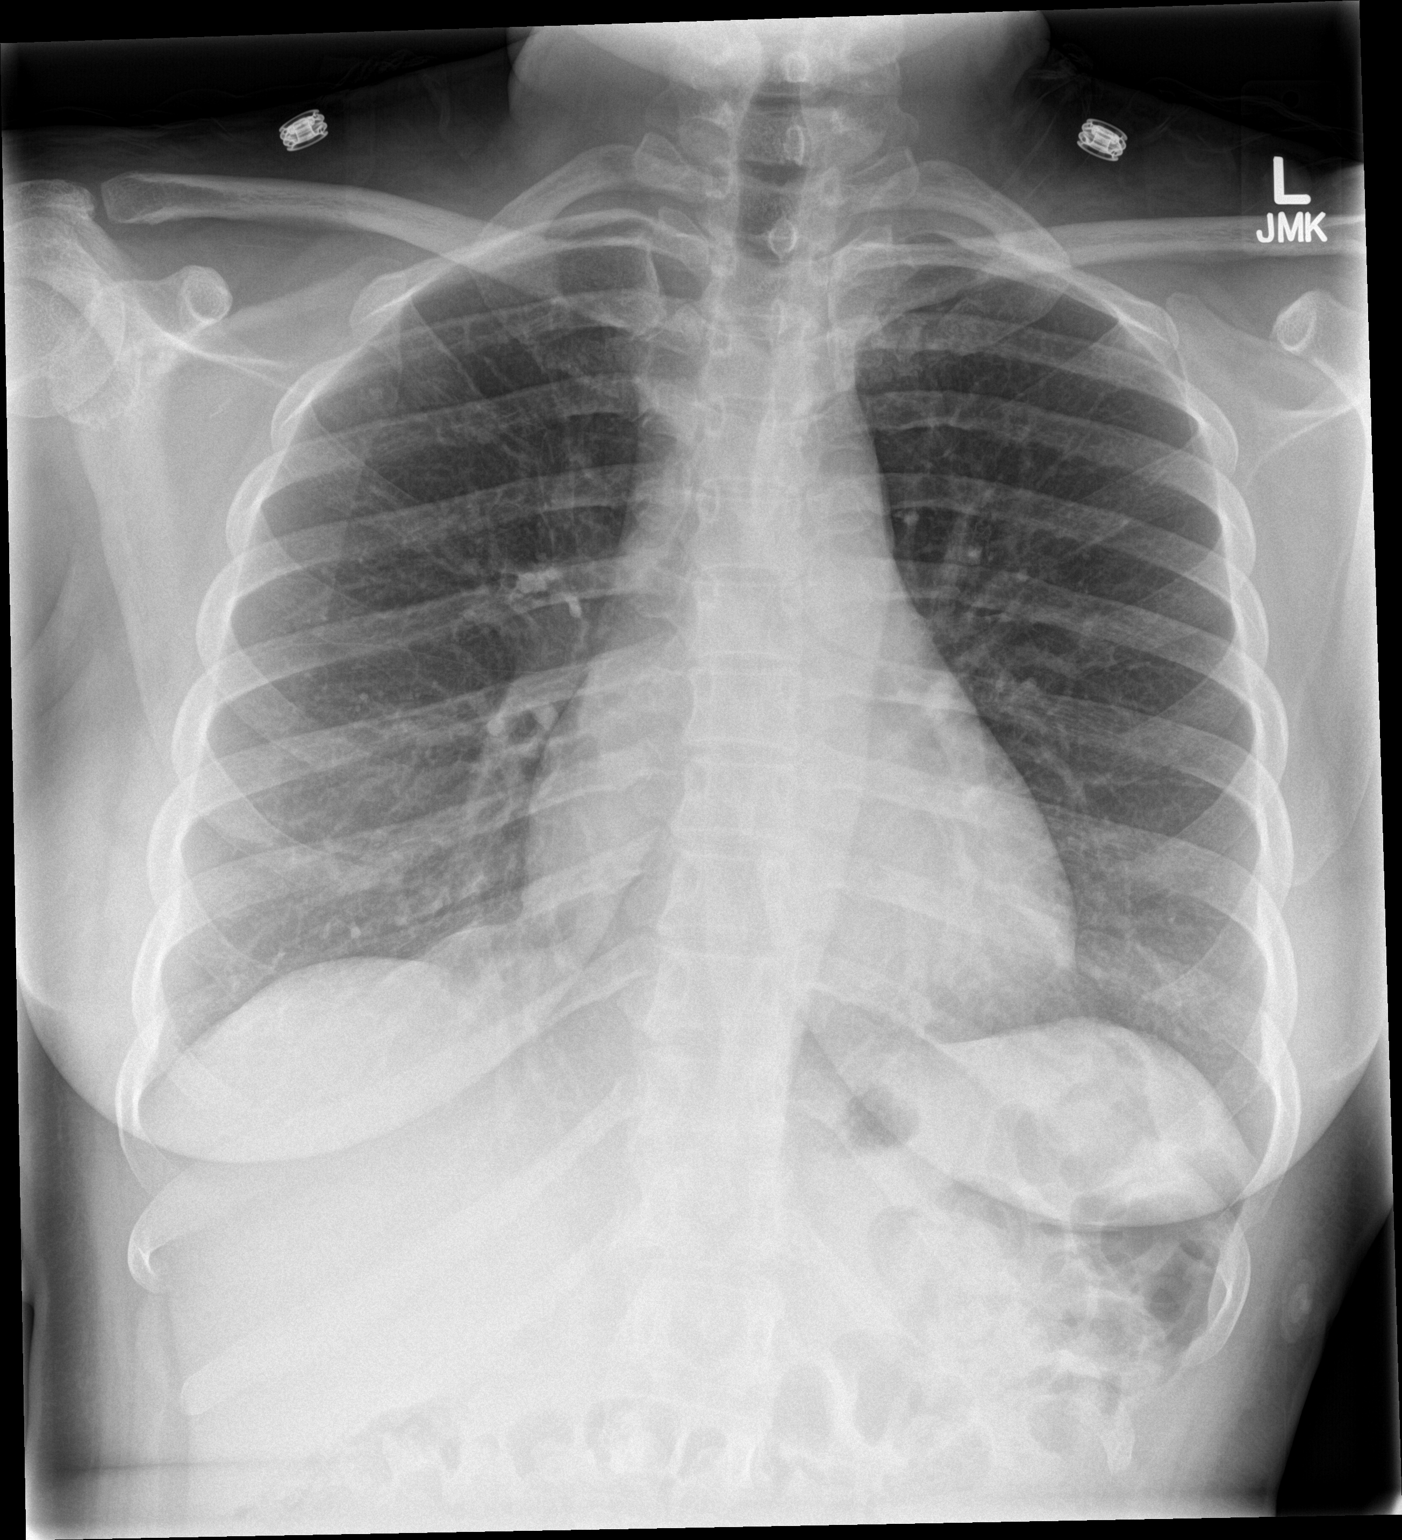

[chest lat]
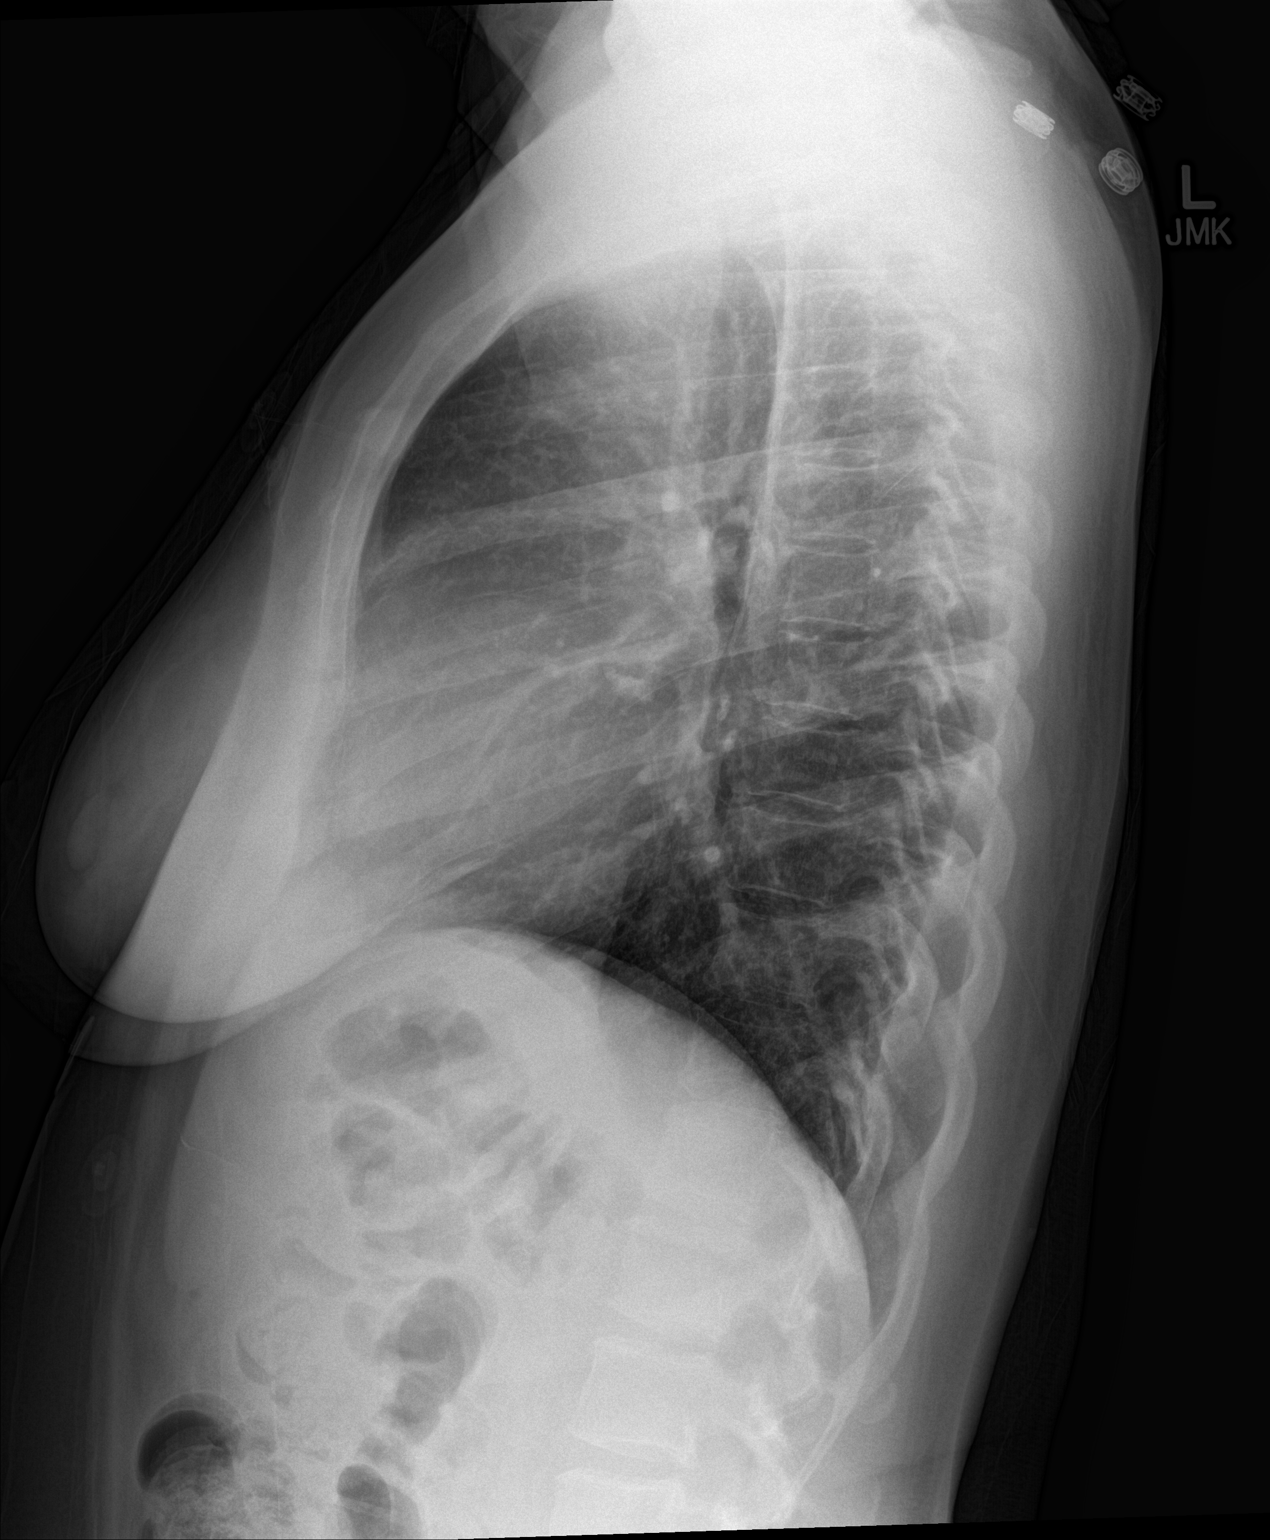

[2 of 2 positions shown; findings below may reference images not displayed]

FINDINGS: The heart size and mediastinal contours are within normal limits.
Both lungs are clear. The visualized skeletal structures are
unremarkable.
IMPRESSION: No active cardiopulmonary disease.

## 2016-09-29 ENCOUNTER — Encounter (HOSPITAL_COMMUNITY): Payer: Self-pay | Admitting: Adult Health

## 2016-09-29 ENCOUNTER — Emergency Department (HOSPITAL_COMMUNITY)
Admission: EM | Admit: 2016-09-29 | Discharge: 2016-09-29 | Disposition: A | Discharge status: Home / Self Care | Payer: Self-pay | Attending: Emergency Medicine | Admitting: Emergency Medicine

## 2016-09-29 DIAGNOSIS — Z79899 Other long term (current) drug therapy: Secondary | ICD-10-CM | POA: Insufficient documentation

## 2016-09-29 DIAGNOSIS — J45909 Unspecified asthma, uncomplicated: Secondary | ICD-10-CM | POA: Insufficient documentation

## 2016-09-29 DIAGNOSIS — G43009 Migraine without aura, not intractable, without status migrainosus: Secondary | ICD-10-CM | POA: Insufficient documentation

## 2016-09-29 MED ORDER — ONDANSETRON 4 MG PO TBDP
4.0000 mg | ORAL_TABLET | Freq: Once | ORAL | Status: AC | PRN
  Administered 2016-09-29: 4 mg via ORAL

## 2016-09-29 MED ORDER — ONDANSETRON 4 MG PO TBDP
ORAL_TABLET | ORAL | Status: AC
  Filled 2016-09-29: qty 1

## 2016-09-29 MED ORDER — KETOROLAC TROMETHAMINE 30 MG/ML IJ SOLN
30.0000 mg | Freq: Once | INTRAMUSCULAR | Status: AC
  Administered 2016-09-29: 30 mg via INTRAVENOUS
  Filled 2016-09-29: qty 1

## 2016-09-29 MED ORDER — SODIUM CHLORIDE 0.9 % IV SOLN
Freq: Once | INTRAVENOUS | Status: AC
  Administered 2016-09-29: 19:00:00 via INTRAVENOUS

## 2016-09-29 MED ORDER — METOCLOPRAMIDE HCL 5 MG/ML IJ SOLN
10.0000 mg | Freq: Once | INTRAMUSCULAR | Status: AC
  Administered 2016-09-29: 10 mg via INTRAVENOUS
  Filled 2016-09-29: qty 2

## 2016-09-29 MED ORDER — DIPHENHYDRAMINE HCL 50 MG/ML IJ SOLN
25.0000 mg | Freq: Once | INTRAMUSCULAR | Status: AC
  Administered 2016-09-29: 25 mg via INTRAVENOUS
  Filled 2016-09-29: qty 1

## 2016-09-29 NOTE — ED Notes (Signed)
PT states understanding of care given. PT ambulated from ED to car with a steady gait. 

## 2016-09-29 NOTE — Discharge Instructions (Signed)
Return if any problems.

## 2016-09-29 NOTE — ED Provider Notes (Signed)
[facility] DEPT Provider Note   CSN: [id number] Arrival date & time: 09/29/16  1605  By signing my name below, I, Marcello Moores, attest that this documentation has been prepared under the direction and in the presence of Jaydyn Bozzo K. Macallister Ashmead, PA-C. Electronically Signed: Marcello Moores, ED Scribe. 09/29/16. 6:36 PM.  History   Chief Complaint Chief Complaint  Patient presents with  . Migraine   The history is provided by the patient. No language interpreter was used.   HPI Comments: Marcia Pham is a 36 y.o. female who presents to the Emergency Department complaining of a sudden onset of severe headache with associated 4 episodes of emesis that began today. The pt also notes associated chills and photophobia. The pt has a PMHx of migraines. She denies a h/x of smoking and drinking.   Past Medical History:  Diagnosis Date  . Asthma   . Migraines   . Shingles     There are no active problems to display for this patient.   Past Surgical History:  Procedure Laterality Date  . ARM SURGERY      OB History    No data available       Home Medications    Prior to Admission medications   Medication Sig Start Date End Date Taking? Authorizing Provider  acetaminophen-codeine 120-12 MG/5ML solution Take 5 mLs by mouth every 4 (four) hours as needed for moderate pain. Patient not taking: Reported on 10/12/2015 09/12/15   Delos Haring, PA-C  albuterol (PROVENTIL HFA;VENTOLIN HFA) 108 (90 BASE) MCG/ACT inhaler Inhale 1-2 puffs into the lungs every 6 (six) hours as needed for wheezing or shortness of breath. 03/07/15   Sam, Olivia Canter, PA-C  amoxicillin (AMOXIL) 500 MG capsule Take 1 capsule (500 mg total) by mouth 3 (three) times daily. Patient not taking: Reported on 09/12/2015 08/17/15   Domenic Moras, PA-C  benzonatate (TESSALON) 100 MG capsule Take 1 capsule (100 mg total) by mouth 3 (three) times daily as needed for cough. 03/05/16   Ward, Ozella Almond, PA-C  cetirizine  (ZYRTEC ALLERGY) 10 MG tablet Take 1 tablet (10 mg total) by mouth daily. Patient not taking: Reported on 09/12/2015 07/09/15   Montine Circle, PA-C  guaiFENesin-codeine [facility]) 100-10 MG/5ML syrup Take 5 mLs by mouth at bedtime as needed for cough. 03/05/16   Ward, Ozella Almond, PA-C  ibuprofen (ADVIL,MOTRIN) 800 MG tablet Take 1 tablet (800 mg total) by mouth 3 (three) times daily. Patient not taking: Reported on 09/12/2015 09/11/15   Ward, Ozella Almond, PA-C  ondansetron (ZOFRAN) 4 MG tablet Take 1 tablet (4 mg total) by mouth every 8 (eight) hours as needed for nausea or vomiting. 10/30/15   Mackuen, Fredia Sorrow, MD    Family History History reviewed. No pertinent family history.  Social History Social History  Substance Use Topics  . Smoking status: Never Smoker  . Smokeless tobacco: Never Used  . Alcohol use No     Comment: "once a month"     Allergies   Patient has no known allergies.   Review of Systems Review of Systems  Constitutional: Positive for chills.  Eyes: Positive for photophobia.  Gastrointestinal: Positive for vomiting.  Neurological: Positive for headaches.  All other systems reviewed and are negative.    Physical Exam Updated Vital Signs BP 136/89 (BP Location: Right Arm)   Pulse 71   Temp 98.7 F (37.1 C) (Oral)   Resp 18   Wt 153 lb (69.4 kg)   LMP 09/15/2016 (Exact Date)  SpO2 100%   BMI 29.88 kg/m   Physical Exam  Constitutional: She is oriented to person, place, and time. She appears well-developed and well-nourished.  HENT:  Head: Normocephalic.  Eyes: EOM are normal.  Neck: Normal range of motion.  Pulmonary/Chest: Effort normal.  Abdominal: She exhibits no distension.  Musculoskeletal: Normal range of motion.  Neurological: She is alert and oriented to person, place, and time.  Strength and sensation equal and intact bilaterally throughout the upper and lower extremities.Normal gait. Coordination intact.      Psychiatric: She has a normal mood and affect.  Nursing note and vitals reviewed.    ED Treatments / Results   DIAGNOSTIC STUDIES: Oxygen Saturation is 100% on RA, normal by my interpretation.   COORDINATION OF CARE: 6:30 PM-Discussed next steps with pt. Pt verbalized understanding and is agreeable with the plan.   Labs (all labs ordered are listed, but only abnormal results are displayed) Labs Reviewed - No data to display  EKG  EKG Interpretation None       Radiology No results found.  Procedures Procedures (including critical care time)  Medications Ordered in ED Medications  ondansetron (ZOFRAN-ODT) 4 MG disintegrating tablet (not administered)  ondansetron (ZOFRAN-ODT) disintegrating tablet 4 mg (4 mg Oral Given 09/29/16 1632)     Initial Impression / Assessment and Plan / ED Course  I have reviewed the triage vital signs and the nursing notes.  Pertinent labs & imaging results that were available during my care of the patient were reviewed by me and considered in my medical decision making (see chart for details).      Patient is without high-risk features of headache including: Sudden onset/thunderclap HA, No similar headache in past, Altered mental status, Accompanying seizure, Headache with exertion, Age > 50, History of immunocompromise, Neck or shoulder pain, Fever, Use of anticoagulation, Family history of spontaneous SAH, Concomitant drug use, Toxic exposure.  Patient has a normal complete neurological exam, normal vital signs, normal level of consciousness, no signs of meningismus, is well-appearing/non-toxic appearing, no signs of trauma. No papilledema, no pain over the temporal arteries. Imaging with CT/MRI not indicated given history and physical exam findings. No dangerous or life-threatening conditions suspected or identified by history, physical exam, and by work-up. No indications for hospitalization identified.   Final Clinical Impressions(s)  / ED Diagnoses   Final diagnoses:  Migraine without aura and without status migrainosus, not intractable    New Prescriptions New Prescriptions   No medications on file    An After Visit Summary was printed and given to the patient.    Fransico Meadow, Hershal Coria 09/29/16 1953    Orlie Dakin, MD 09/29/16 2128

## 2016-09-29 NOTE — ED Triage Notes (Signed)
Presents with migraine that began this AM and is progressively getting worse, associated with nausea. Endorses dizziness, photophobia and sensitvity to sound. This is her typical migraine. No medications taken prior to arrival.

## 2016-11-09 ENCOUNTER — Encounter (HOSPITAL_COMMUNITY): Payer: Self-pay | Admitting: Emergency Medicine

## 2016-11-09 ENCOUNTER — Emergency Department (HOSPITAL_COMMUNITY)
Admission: EM | Admit: 2016-11-09 | Discharge: 2016-11-09 | Disposition: A | Discharge status: Home / Self Care | Payer: Self-pay | Attending: Emergency Medicine | Admitting: Emergency Medicine

## 2016-11-09 DIAGNOSIS — Z5321 Procedure and treatment not carried out due to patient leaving prior to being seen by health care provider: Secondary | ICD-10-CM | POA: Insufficient documentation

## 2016-11-09 DIAGNOSIS — R51 Headache: Secondary | ICD-10-CM | POA: Insufficient documentation

## 2016-11-09 MED ORDER — ONDANSETRON 4 MG PO TBDP
ORAL_TABLET | ORAL | Status: AC
  Filled 2016-11-09: qty 1

## 2016-11-09 MED ORDER — ONDANSETRON 4 MG PO TBDP
4.0000 mg | ORAL_TABLET | Freq: Once | ORAL | Status: AC
  Administered 2016-11-09: 4 mg via ORAL

## 2016-11-09 NOTE — ED Triage Notes (Signed)
Pt presents to ED for assessment of headache since this morning, hx of migraines, sensitivity to light and sound.  Patient c/o nausea as well.  No neuro deficits noted at this time.

## 2016-12-04 ENCOUNTER — Emergency Department (HOSPITAL_COMMUNITY): Admission: EM | Admit: 2016-12-04 | Discharge: 2016-12-04 | Discharge status: Against Medical Advice | Payer: Self-pay

## 2016-12-04 NOTE — ED Notes (Signed)
Called 3x, no response.  

## 2016-12-04 NOTE — ED Notes (Signed)
No answer when called for triage x 3.  

## 2016-12-05 ENCOUNTER — Emergency Department (HOSPITAL_COMMUNITY)
Admission: EM | Admit: 2016-12-05 | Discharge: 2016-12-05 | Disposition: A | Discharge status: Home / Self Care | Payer: Self-pay | Attending: Emergency Medicine | Admitting: Emergency Medicine

## 2016-12-05 ENCOUNTER — Encounter (HOSPITAL_COMMUNITY): Payer: Self-pay

## 2016-12-05 DIAGNOSIS — J45909 Unspecified asthma, uncomplicated: Secondary | ICD-10-CM | POA: Insufficient documentation

## 2016-12-05 DIAGNOSIS — R519 Headache, unspecified: Secondary | ICD-10-CM

## 2016-12-05 DIAGNOSIS — R51 Headache: Secondary | ICD-10-CM | POA: Insufficient documentation

## 2016-12-05 LAB — I-STAT CHEM 8, ED
BUN: 9 mg/dL (ref 6–20)
CALCIUM ION: 1.12 mmol/L — AB (ref 1.15–1.40)
Chloride: 104 mmol/L (ref 101–111)
Creatinine, Ser: 0.5 mg/dL (ref 0.44–1.00)
Glucose, Bld: 89 mg/dL (ref 65–99)
HEMATOCRIT: 43 % (ref 36.0–46.0)
HEMOGLOBIN: 14.6 g/dL (ref 12.0–15.0)
Potassium: 3.6 mmol/L (ref 3.5–5.1)
SODIUM: 140 mmol/L (ref 135–145)
TCO2: 25 mmol/L (ref 22–32)

## 2016-12-05 LAB — I-STAT BETA HCG BLOOD, ED (MC, WL, AP ONLY): I-stat hCG, quantitative: <5 m[IU]/mL (ref <5)

## 2016-12-05 MED ORDER — ONDANSETRON 4 MG PO TBDP
ORAL_TABLET | ORAL | Status: AC
  Filled 2016-12-05: qty 1

## 2016-12-05 MED ORDER — SODIUM CHLORIDE 0.9 % IV BOLUS (SEPSIS)
1000.0000 mL | Freq: Once | INTRAVENOUS | Status: AC
  Administered 2016-12-05: 1000 mL via INTRAVENOUS

## 2016-12-05 MED ORDER — DEXAMETHASONE SODIUM PHOSPHATE 10 MG/ML IJ SOLN
10.0000 mg | Freq: Once | INTRAMUSCULAR | Status: AC
  Administered 2016-12-05: 10 mg via INTRAVENOUS
  Filled 2016-12-05: qty 1

## 2016-12-05 MED ORDER — PROCHLORPERAZINE EDISYLATE 5 MG/ML IJ SOLN
10.0000 mg | Freq: Once | INTRAMUSCULAR | Status: AC
  Administered 2016-12-05: 10 mg via INTRAVENOUS
  Filled 2016-12-05: qty 2

## 2016-12-05 MED ORDER — OXYCODONE-ACETAMINOPHEN 5-325 MG PO TABS
ORAL_TABLET | ORAL | Status: AC
  Filled 2016-12-05: qty 1

## 2016-12-05 NOTE — ED Triage Notes (Signed)
Pt presents to the ed with family with complaints of headache x 2 days and nausea. Pt states this feels like all of her migraines.  She has had a lot of recent stress at home.  No neurological deficits present. Alert oriented and ambulatory.

## 2016-12-05 NOTE — ED Provider Notes (Signed)
[facility] DEPT Provider Note   CSN: [id number] Arrival date & time: 12/05/16  4098     History   Chief Complaint Chief Complaint  Patient presents with  . Migraine   HPI  Blood pressure 134/82, pulse (!) 59, temperature 98 F (36.7 C), temperature source Oral, resp. rate 17, weight 69.4 kg (153 lb), SpO2 100 %.  Marcia Pham is a 36 y.o. female complaining of Bitemporal headache exacerbation onset 2 days ago with associated nausea and photophobia, typical for her prior headaches. She's taken ibuprofen and acetaminophen at home with little relief. She describes the pain as throbbing, 8 out of 10. Pt denies fever, rash, confusion, cervicalgia, LOC/syncope, change in vision, numbness, weakness, dysarthria, ataxia, thunderclap onset, exacerbation with exertion or valsalva, exacerbation in morning, CP, SOB, abdominal pain.  Past Medical History:  Diagnosis Date  . Asthma   . Migraines   . Shingles     There are no active problems to display for this patient.   Past Surgical History:  Procedure Laterality Date  . ARM SURGERY      OB History    No data available       Home Medications    Prior to Admission medications   Medication Sig Start Date End Date Taking? Authorizing Provider  acetaminophen-codeine 120-12 MG/5ML solution Take 5 mLs by mouth every 4 (four) hours as needed for moderate pain. Patient not taking: Reported on 10/12/2015 09/12/15   Delos Haring, PA-C  albuterol (PROVENTIL HFA;VENTOLIN HFA) 108 (90 BASE) MCG/ACT inhaler Inhale 1-2 puffs into the lungs every 6 (six) hours as needed for wheezing or shortness of breath. 03/07/15   Sam, Olivia Canter, PA-C  amoxicillin (AMOXIL) 500 MG capsule Take 1 capsule (500 mg total) by mouth 3 (three) times daily. Patient not taking: Reported on 09/12/2015 08/17/15   Domenic Moras, PA-C  benzonatate (TESSALON) 100 MG capsule Take 1 capsule (100 mg total) by mouth 3 (three) times daily as needed for cough. 03/05/16   Ward,  Ozella Almond, PA-C  cetirizine (ZYRTEC ALLERGY) 10 MG tablet Take 1 tablet (10 mg total) by mouth daily. Patient not taking: Reported on 09/12/2015 07/09/15   Montine Circle, PA-C  guaiFENesin-codeine [facility]) 100-10 MG/5ML syrup Take 5 mLs by mouth at bedtime as needed for cough. 03/05/16   Ward, Ozella Almond, PA-C  ibuprofen (ADVIL,MOTRIN) 800 MG tablet Take 1 tablet (800 mg total) by mouth 3 (three) times daily. Patient not taking: Reported on 09/12/2015 09/11/15   Ward, Ozella Almond, PA-C  ondansetron (ZOFRAN) 4 MG tablet Take 1 tablet (4 mg total) by mouth every 8 (eight) hours as needed for nausea or vomiting. 10/30/15   Mackuen, Fredia Sorrow, MD    Family History No family history on file.  Social History Social History  Substance Use Topics  . Smoking status: Never Smoker  . Smokeless tobacco: Never Used  . Alcohol use No     Comment: "once a month"     Allergies   Patient has no known allergies.   Review of Systems Review of Systems  A complete review of systems was obtained and all systems are negative except as noted in the HPI and PMH.    Physical Exam Updated Vital Signs BP 121/69   Pulse 72   Temp 98 F (36.7 C) (Oral)   Resp 17   Wt 69.4 kg (153 lb)   SpO2 98%   BMI 29.88 kg/m   Physical Exam  Constitutional: She is oriented to person,  place, and time. She appears well-developed and well-nourished.  HENT:  Head: Normocephalic and atraumatic.  Mouth/Throat: Oropharynx is clear and moist.  Eyes: Pupils are equal, round, and reactive to light. Conjunctivae and EOM are normal.  Positive photophobia  No TTP of maxillary or frontal sinuses  No TTP or induration of temporal arteries bilaterally  Neck: Normal range of motion. Neck supple.  FROM to C-spine. Pt can touch chin to chest without discomfort. No TTP of midline cervical spine.   Cardiovascular: Normal rate, regular rhythm and intact distal pulses.   Pulmonary/Chest: Effort normal and  breath sounds normal. No respiratory distress. She has no wheezes. She has no rales. She exhibits no tenderness.  Abdominal: Soft. Bowel sounds are normal. There is no tenderness.  Musculoskeletal: Normal range of motion. She exhibits no edema or tenderness.  Neurological: She is alert and oriented to person, place, and time. No cranial nerve deficit.  II-Visual fields grossly intact. III/IV/VI-Extraocular movements intact.  Pupils reactive bilaterally. V/VII-Smile symmetric, equal eyebrow raise,  facial sensation intact VIII- Hearing grossly intact IX/X-Normal gag XI-bilateral shoulder shrug XII-midline tongue extension Motor: 5/5 bilaterally with normal tone and bulk Cerebellar: Normal finger-to-nose  and normal heel-to-shin test.   Romberg negative Ambulates with a coordinated gait   Nursing note and vitals reviewed.    ED Treatments / Results  Labs (all labs ordered are listed, but only abnormal results are displayed) Labs Reviewed  I-STAT CHEM 8, ED - Abnormal; Notable for the following:       Result Value   Calcium, Ion 1.12 (*)    All other components within normal limits  I-STAT BETA HCG BLOOD, ED ([facility], [facility], [facility] ONLY)    EKG  EKG Interpretation None       Radiology No results found.  Procedures Procedures (including critical care time)  Medications Ordered in ED Medications  ondansetron (ZOFRAN-ODT) 4 MG disintegrating tablet (not administered)  oxyCODONE-acetaminophen (PERCOCET/ROXICET) 5-325 MG per tablet (not administered)  sodium chloride 0.9 % bolus 1,000 mL (0 mLs Intravenous Stopped 12/05/16 1220)  prochlorperazine (COMPAZINE) injection 10 mg (10 mg Intravenous Given 12/05/16 1145)  dexamethasone (DECADRON) injection 10 mg (10 mg Intravenous Given 12/05/16 1145)     Initial Impression / Assessment and Plan / ED Course  I have reviewed the triage vital signs and the nursing notes.  Pertinent labs & imaging results that were available during my care of  the patient were reviewed by me and considered in my medical decision making (see chart for details).     Vitals:   12/05/16 0729 12/05/16 0730 12/05/16 1023 12/05/16 1130  BP: (!) 128/92  134/82 121/69  Pulse: 73  (!) 59 72  Resp: 18  17   Temp: (!) 97.3 F (36.3 C)  98 F (36.7 C)   TempSrc:   Oral   SpO2: 100%  100% 98%  Weight:  69.4 kg (153 lb)      Medications  ondansetron (ZOFRAN-ODT) 4 MG disintegrating tablet (not administered)  oxyCODONE-acetaminophen (PERCOCET/ROXICET) 5-325 MG per tablet (not administered)  sodium chloride 0.9 % bolus 1,000 mL (0 mLs Intravenous Stopped 12/05/16 1220)  prochlorperazine (COMPAZINE) injection 10 mg (10 mg Intravenous Given 12/05/16 1145)  dexamethasone (DECADRON) injection 10 mg (10 mg Intravenous Given 12/05/16 1145)    Kaedynce Tapp is 36 y.o. female presenting with HA. Presentation is like pts typical HA and non concerning for [facility], ICH, Meningitis, or temporal arteritis. Pt is afebrile with no focal neuro deficits, nuchal rigidity, or  change in vision. Patient given headache cocktail, I was informed by the nurse that the patient is very antsy and removed her IV and wanted to leave. I will unfortunately, I was not able to evaluate the patient before she left. This is likely a dystonic reaction to the Compazine.   Final Clinical Impressions(s) / ED Diagnoses   Final diagnoses:  Nonintractable headache, unspecified chronicity pattern, unspecified headache type    New Prescriptions Discharge Medication List as of 12/05/2016 12:29 PM       Manly Nestle, Charna Elizabeth 12/05/16 1414    Drenda Freeze, MD 12/06/16 [phone]

## 2016-12-05 NOTE — ED Notes (Signed)
Pt informed RN that she was uncomfortable and desired to leave. This RN left room to locate EDP. RN returned to room to find pt had removed her IV and found pt sitting on bed with blood running from IV site. Site bandaged, cleaned her hand of blood. EDP notified. While RN was out of room, pt left with family without discussing situation with EDP. EDP notified.

## 2017-02-01 ENCOUNTER — Emergency Department (HOSPITAL_COMMUNITY)
Admission: EM | Admit: 2017-02-01 | Discharge: 2017-02-01 | Disposition: A | Discharge status: Home / Self Care | Payer: No Typology Code available for payment source | Attending: Emergency Medicine | Admitting: Emergency Medicine

## 2017-02-01 ENCOUNTER — Encounter (HOSPITAL_COMMUNITY): Payer: Self-pay | Admitting: Student

## 2017-02-01 DIAGNOSIS — S50812A Abrasion of left forearm, initial encounter: Secondary | ICD-10-CM | POA: Insufficient documentation

## 2017-02-01 DIAGNOSIS — Y939 Activity, unspecified: Secondary | ICD-10-CM | POA: Diagnosis not present

## 2017-02-01 DIAGNOSIS — M542 Cervicalgia: Secondary | ICD-10-CM | POA: Insufficient documentation

## 2017-02-01 DIAGNOSIS — R1013 Epigastric pain: Secondary | ICD-10-CM | POA: Diagnosis not present

## 2017-02-01 DIAGNOSIS — Y929 Unspecified place or not applicable: Secondary | ICD-10-CM | POA: Insufficient documentation

## 2017-02-01 DIAGNOSIS — Y999 Unspecified external cause status: Secondary | ICD-10-CM | POA: Insufficient documentation

## 2017-02-01 DIAGNOSIS — M79632 Pain in left forearm: Secondary | ICD-10-CM | POA: Diagnosis not present

## 2017-02-01 DIAGNOSIS — W2210XA Striking against or struck by unspecified automobile airbag, initial encounter: Secondary | ICD-10-CM | POA: Insufficient documentation

## 2017-02-01 DIAGNOSIS — Z041 Encounter for examination and observation following transport accident: Secondary | ICD-10-CM | POA: Diagnosis present

## 2017-02-01 MED ORDER — IBUPROFEN 800 MG PO TABS
800.0000 mg | ORAL_TABLET | Freq: Once | ORAL | Status: AC
  Administered 2017-02-01: 800 mg via ORAL
  Filled 2017-02-01: qty 1
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
## 2017-07-13 ENCOUNTER — Emergency Department (HOSPITAL_COMMUNITY)
Admission: EM | Admit: 2017-07-13 | Discharge: 2017-07-14 | Disposition: A | Discharge status: Home / Self Care | Payer: Self-pay | Attending: Emergency Medicine | Admitting: Emergency Medicine

## 2017-07-13 ENCOUNTER — Encounter (HOSPITAL_COMMUNITY): Payer: Self-pay

## 2017-07-13 DIAGNOSIS — S61511A Laceration without foreign body of right wrist, initial encounter: Secondary | ICD-10-CM | POA: Insufficient documentation

## 2017-07-13 DIAGNOSIS — Y998 Other external cause status: Secondary | ICD-10-CM | POA: Insufficient documentation

## 2017-07-13 DIAGNOSIS — Y9389 Activity, other specified: Secondary | ICD-10-CM | POA: Insufficient documentation

## 2017-07-13 DIAGNOSIS — Z23 Encounter for immunization: Secondary | ICD-10-CM | POA: Insufficient documentation

## 2017-07-13 DIAGNOSIS — F6089 Other specific personality disorders: Secondary | ICD-10-CM | POA: Diagnosis present

## 2017-07-13 DIAGNOSIS — X789XXA Intentional self-harm by unspecified sharp object, initial encounter: Secondary | ICD-10-CM

## 2017-07-13 DIAGNOSIS — R4689 Other symptoms and signs involving appearance and behavior: Secondary | ICD-10-CM

## 2017-07-13 DIAGNOSIS — Y92009 Unspecified place in unspecified non-institutional (private) residence as the place of occurrence of the external cause: Secondary | ICD-10-CM | POA: Insufficient documentation

## 2017-07-13 DIAGNOSIS — X781XXA Intentional self-harm by knife, initial encounter: Secondary | ICD-10-CM | POA: Insufficient documentation

## 2017-07-13 DIAGNOSIS — J45909 Unspecified asthma, uncomplicated: Secondary | ICD-10-CM | POA: Insufficient documentation

## 2017-07-13 DIAGNOSIS — R45851 Suicidal ideations: Secondary | ICD-10-CM

## 2017-07-13 DIAGNOSIS — F321 Major depressive disorder, single episode, moderate: Secondary | ICD-10-CM | POA: Insufficient documentation

## 2017-07-13 DIAGNOSIS — Z79899 Other long term (current) drug therapy: Secondary | ICD-10-CM | POA: Insufficient documentation

## 2017-07-13 DIAGNOSIS — Z046 Encounter for general psychiatric examination, requested by authority: Secondary | ICD-10-CM | POA: Insufficient documentation

## 2017-07-13 LAB — I-STAT BETA HCG BLOOD, ED (MC, WL, AP ONLY): I-stat hCG, quantitative: <5 m[IU]/mL (ref <5)

## 2017-07-13 NOTE — ED Notes (Signed)
Bed: WTR8 Expected date:  Expected time:  Means of arrival:  Comments: 
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
## 2017-07-14 DIAGNOSIS — F6089 Other specific personality disorders: Secondary | ICD-10-CM | POA: Diagnosis present

## 2017-07-14 LAB — CBC
HCT: 41.2 % (ref 36.0–46.0)
Hemoglobin: 14.2 g/dL (ref 12.0–15.0)
MCH: 29.8 pg (ref 26.0–34.0)
MCHC: 34.5 g/dL (ref 30.0–36.0)
MCV: 86.4 fL (ref 78.0–100.0)
PLATELETS: 446 10*3/uL — AB (ref 150–400)
RBC: 4.77 MIL/uL (ref 3.87–5.11)
RDW: 12.5 % (ref 11.5–15.5)
WBC: 9.8 10*3/uL (ref 4.0–10.5)

## 2017-07-14 LAB — COMPREHENSIVE METABOLIC PANEL
ALBUMIN: 4.4 g/dL (ref 3.5–5.0)
ALT: 18 U/L (ref 14–54)
ANION GAP: 11 (ref 5–15)
AST: 14 U/L — ABNORMAL LOW (ref 15–41)
Alkaline Phosphatase: 71 U/L (ref 38–126)
BILIRUBIN TOTAL: 0.6 mg/dL (ref 0.3–1.2)
BUN: 12 mg/dL (ref 6–20)
CALCIUM: 9.2 mg/dL (ref 8.9–10.3)
CO2: 22 mmol/L (ref 22–32)
Chloride: 106 mmol/L (ref 101–111)
Creatinine, Ser: 0.62 mg/dL (ref 0.44–1.00)
GFR calc non Af Amer: >60 mL/min (ref >60)
GLUCOSE: 102 mg/dL — AB (ref 65–99)
POTASSIUM: 3.1 mmol/L — AB (ref 3.5–5.1)
SODIUM: 139 mmol/L (ref 135–145)
TOTAL PROTEIN: 8 g/dL (ref 6.5–8.1)

## 2017-07-14 LAB — SALICYLATE LEVEL

## 2017-07-14 LAB — ACETAMINOPHEN LEVEL

## 2017-07-14 LAB — ETHANOL: Alcohol, Ethyl (B): <10 mg/dL (ref <10)

## 2017-07-14 MED ORDER — TETANUS-DIPHTH-ACELL PERTUSSIS 5-2.5-18.5 LF-MCG/0.5 IM SUSP
0.5000 mL | Freq: Once | INTRAMUSCULAR | Status: AC
  Administered 2017-07-14: 0.5 mL via INTRAMUSCULAR
  Filled 2017-07-14: qty 0.5

## 2017-07-14 MED ORDER — POTASSIUM CHLORIDE CRYS ER 20 MEQ PO TBCR
40.0000 meq | EXTENDED_RELEASE_TABLET | Freq: Once | ORAL | Status: DC
  Filled 2017-07-14: qty 2

## 2017-07-14 NOTE — [facility] Suicide Risk Assessment (Cosign Needed)
Suicide Risk Assessment  Discharge Assessment   [facility] Discharge Suicide Risk Assessment   Principal Problem: Cluster B personality disorder [facility]) Discharge Diagnoses:  Patient Active Problem List   Diagnosis Date Noted  . Cluster B personality disorder ([place]) [F60.89] 07/14/2017   Pt was seen and chart reviewed with treatment team and Dr Mariea Clonts. Pt denies suicidal/homicidal ideation, denies auditory/visual hallucinations and does not appear to be responding to internal stimuli. Pt stated she lives with her boyfriend and he pays more attention to his friends than her so she cut her arm to get his attention. Pt's right forearm has a superficial cut which required not sutures or dressing. Pt denies she has ever cut before. Pt stated she and her boyfriend both work all the time and sometimes she feels overwhelmed. Pt stated she has never seen a psychiatrist, is on no medications and does not want to see a therapist because, "those are for crazy people and I am not crazy." Pt's BAL negative, UDS pending. Pt is psychiatrically clear and stable for discharge.   Total Time spent with patient: 30 minutes  Musculoskeletal: Strength & Muscle Tone: within normal limits Gait & Station: normal Patient leans: N/A  Psychiatric Specialty Exam:   Blood pressure 108/69, pulse 76, temperature 97.7 F (36.5 C), temperature source Oral, resp. rate 16, height 5' (1.524 m), weight 150 lb (68 kg), SpO2 98 %.Body mass index is 29.29 kg/m.  General Appearance: Casual  Eye Contact::  Good  Speech:  Clear and Coherent and Normal Rate409  Volume:  Normal  Mood:  Anxious and Irritable  Affect:  Congruent  Thought Process:  Coherent, Goal Directed and Linear  Orientation:  Full (Time, Place, and Person)  Thought Content:  Logical  Suicidal Thoughts:  No  Homicidal Thoughts:  No  Memory:  Immediate;   Good Recent;   Good Remote;   Fair  Judgement:  Fair  Insight:  Fair  Psychomotor Activity:  Normal   Concentration:  Good  Recall:  Good  Fund of Knowledge:Good  Language: Good  Akathisia:  No  Handed:  Right  AIMS (if indicated):     Assets:  Agricultural consultant Housing Physical Health Social Support Vocational/Educational  Sleep:     Cognition: WNL  ADL's:  Intact   Mental Status Per Nursing Assessment::   On Admission:     Demographic Factors:  Low socioeconomic status  Loss Factors: Financial problems/change in socioeconomic status  Historical Factors: Impulsivity  Risk Reduction Factors:   Sense of responsibility to family, Employed and Living with another person, especially a relative  Continued Clinical Symptoms:  Personality Disorders:   Cluster B  Cognitive Features That Contribute To Risk:  Closed-mindedness    Suicide Risk:  Minimal: No identifiable suicidal ideation.  Patients presenting with no risk factors but with morbid ruminations; may be classified as minimal risk based on the severity of the depressive symptoms    Plan Of Care/Follow-up recommendations:  Activity:  as tolerated Diet:  heart Healthy  Ethelene Hal, NP 07/14/2017, 10:42 AM

## 2017-07-14 NOTE — BH Assessment (Signed)
[facility] Assessment Progress Note  Per Buford Dresser, DO, this pt does not require psychiatric hospitalization at this time.  Pt presents under IVC initiated by law enforcement, which Dr Mariea Clonts has rescinded.  Pt is to be discharged from [facility].  She refuses any outpatient referrals.  Pt's nurse, Caren Griffins, has been notified.  Jalene Mullet, [place] Triage Specialist [phone]

## 2017-07-14 NOTE — ED Notes (Signed)
Nurse opened patient's door to check on her and she said, "when am I going home?" " Y'all are going to make me lose my job, keeping me hostage in this hospital."  Reassured patient that the psychiatrist would be in to see her between 9:30 and 10:00.
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
## 2017-07-14 NOTE — ED Notes (Signed)
Patient currently denies SI/HI/AVH at this time. Plan of care discussed. Encouragement and support provided and safety maintain. Q 15 min safety checks in place and video monitoring.

## 2017-10-27 ENCOUNTER — Emergency Department (HOSPITAL_COMMUNITY)
Admission: EM | Admit: 2017-10-27 | Discharge: 2017-10-27 | Disposition: A | Discharge status: Home / Self Care | Payer: Self-pay | Attending: Emergency Medicine | Admitting: Emergency Medicine

## 2017-10-27 DIAGNOSIS — Z79899 Other long term (current) drug therapy: Secondary | ICD-10-CM | POA: Insufficient documentation

## 2017-10-27 DIAGNOSIS — J45909 Unspecified asthma, uncomplicated: Secondary | ICD-10-CM | POA: Insufficient documentation

## 2017-10-27 DIAGNOSIS — K0889 Other specified disorders of teeth and supporting structures: Secondary | ICD-10-CM | POA: Insufficient documentation

## 2017-10-27 MED ORDER — AMOXICILLIN 500 MG PO CAPS: 500.0000 mg | ORAL_CAPSULE | Freq: Three times a day (TID) | ORAL | 0 refills | Status: DC

## 2017-10-27 MED ORDER — IBUPROFEN 400 MG PO TABS
600.0000 mg | ORAL_TABLET | Freq: Once | ORAL | Status: AC
  Administered 2017-10-27: 600 mg via ORAL
  Filled 2017-10-27: qty 1

## 2017-10-27 MED ORDER — BUPIVACAINE-EPINEPHRINE (PF) 0.5% -1:200000 IJ SOLN: 10.0000 mL | Freq: Once | INTRAMUSCULAR | Status: DC

## 2017-10-27 MED ORDER — OXYCODONE-ACETAMINOPHEN 5-325 MG PO TABS
1.0000 | ORAL_TABLET | Freq: Once | ORAL | Status: AC
Start: 2017-10-27 — End: 2017-10-27
  Administered 2017-10-27: 1 via ORAL
  Filled 2017-10-27: qty 1

## 2017-10-27 MED ORDER — BUPIVACAINE HCL (PF) 0.5 % IJ SOLN
10.0000 mL | Freq: Once | INTRAMUSCULAR | Status: DC
  Filled 2017-10-27: qty 10

## 2017-10-27 MED ORDER — OXYCODONE-ACETAMINOPHEN 5-325 MG PO TABS
1.0000 | ORAL_TABLET | Freq: Once | ORAL | Status: AC
  Administered 2017-10-27: 1 via ORAL
  Filled 2017-10-27: qty 1

## 2017-10-27 MED ORDER — NAPROXEN 500 MG PO TABS: 500.0000 mg | ORAL_TABLET | Freq: Two times a day (BID) | ORAL | 0 refills | Status: DC | PRN

## 2017-10-27 NOTE — Discharge Instructions (Signed)
Take antibiotics and pain medicines as discussed. Follow-up with a local dentist soon as he can.

## 2017-10-27 NOTE — ED Triage Notes (Signed)
Pt arrives to ED with dental pain right sided lower. Pt has known dental carrie there.

## 2017-10-27 NOTE — ED Provider Notes (Signed)
[facility] EMERGENCY DEPARTMENT Provider Note   CSN: [id number] Arrival date & time: 10/27/17  1240     History   Chief Complaint Chief Complaint  Patient presents with  . Dental Pain    HPI Marcia Pham is a 37 y.o. female.  Patient with history of asthma, migraines presents with left lower dental pain for the past 2 days constant ache.  Patient has poor dentition on that side.  No fevers or chills.  Patient does not have a current dentist.  Patient denies cigarette smoking.     Past Medical History:  Diagnosis Date  . Asthma   . Migraines   . Shingles     Patient Active Problem List   Diagnosis Date Noted  . Cluster B personality disorder ([place]) 07/14/2017    Past Surgical History:  Procedure Laterality Date  . ARM SURGERY       OB History   None      Home Medications    Prior to Admission medications   Medication Sig Start Date End Date Taking? Authorizing Provider  acetaminophen (TYLENOL) 325 MG tablet Take 650 mg by mouth every 6 (six) hours as needed for mild pain.   Yes [provider]  albuterol (PROVENTIL HFA;VENTOLIN HFA) 108 (90 BASE) MCG/ACT inhaler Inhale 1-2 puffs into the lungs every 6 (six) hours as needed for wheezing or shortness of breath. Patient not taking: Reported on 10/27/2017 03/07/15   Anne Ng, PA-C  amoxicillin (AMOXIL) 500 MG capsule Take 1 capsule (500 mg total) by mouth 3 (three) times daily. 10/27/17   Elnora Morrison, MD  naproxen (NAPROSYN) 500 MG tablet Take 1 tablet (500 mg total) by mouth 2 (two) times daily as needed. 10/27/17   Elnora Morrison, MD    Family History No family history on file.  Social History Social History   Tobacco Use  . Smoking status: Never Smoker  . Smokeless tobacco: Never Used  Substance Use Topics  . Alcohol use: No    Comment: "once a month"  . Drug use: No     Allergies   Patient has no known allergies.   Review of Systems Review of Systems    Constitutional: Negative for chills and fever.  HENT: Positive for dental problem.   Eyes: Negative for visual disturbance.  Respiratory: Negative for shortness of breath.   Cardiovascular: Negative for chest pain.  Gastrointestinal: Negative for abdominal pain and vomiting.  Genitourinary: Negative for dysuria and flank pain.  Musculoskeletal: Negative for back pain, neck pain and neck stiffness.  Skin: Negative for rash.  Neurological: Negative for light-headedness and headaches.     Physical Exam Updated Vital Signs BP 135/77 (BP Location: Right Arm)   Pulse 81   Temp 98.5 F (36.9 C) (Oral)   Resp 20   SpO2 100%   Physical Exam  Constitutional: She is oriented to person, place, and time. She appears well-developed and well-nourished.  HENT:  Head: Normocephalic and atraumatic.  Patient has tenderness to palpation of left lower gingiva just posterior to incisor.  No abscess, no trismus, no edema.  Eyes: Conjunctivae are normal. Right eye exhibits no discharge. Left eye exhibits no discharge.  Neck: Normal range of motion. Neck supple. No tracheal deviation present.  Cardiovascular: Normal rate.  Pulmonary/Chest: Effort normal.  Musculoskeletal: She exhibits no edema.  Neurological: She is alert and oriented to person, place, and time.  Skin: Skin is warm. No rash noted.  Psychiatric: She has a normal  mood and affect.  Nursing note and vitals reviewed.    ED Treatments / Results  Labs (all labs ordered are listed, but only abnormal results are displayed) Labs Reviewed - No data to display  EKG None  Radiology No results found.  Procedures Dental Block Date/Time: 10/27/2017 5:01 PM Performed by: Elnora Morrison, MD Authorized by: Elnora Morrison, MD   Consent:    Consent obtained:  Verbal   Consent given by:  Patient   Risks discussed:  Allergic reaction, infection and hematoma   Alternatives discussed:  No treatment Indications:    Indications: dental  pain   Location:    Block type:  Inferior alveolar Procedure details (see MAR for exact dosages):    Syringe type:  Aspirating dental syringe   Needle gauge:  25 G   Anesthetic injected:  Bupivacaine 0.5% w/o epi   Injection procedure:  Anatomic landmarks identified and introduced needle Post-procedure details:    Outcome:  Pain improved   Patient tolerance of procedure:  Tolerated well, no immediate complications   (including critical care time)  Medications Ordered in ED Medications  bupivacaine (MARCAINE) 0.5 % injection 10 mL (has no administration in time range)  oxyCODONE-acetaminophen (PERCOCET/ROXICET) 5-325 MG per tablet 1 tablet (1 tablet Oral Given 10/27/17 1541)     Initial Impression / Assessment and Plan / ED Course  I have reviewed the triage vital signs and the nursing notes.  Pertinent labs & imaging results that were available during my care of the patient were reviewed by me and considered in my medical decision making (see chart for details).    Patient presents with dental pain no signs of significant infection.  Discussed plan for oral antibiotics, pain meds and dental follow-up.  Work note given. Dental block in ED.   Final Clinical Impressions(s) / ED Diagnoses   Final diagnoses:  Pain, dental    ED Discharge Orders         Ordered    naproxen (NAPROSYN) 500 MG tablet  2 times daily PRN     10/27/17 1651    amoxicillin (AMOXIL) 500 MG capsule  3 times daily,   Status:  Discontinued     10/27/17 1651    amoxicillin (AMOXIL) 500 MG capsule  3 times daily     10/27/17 1651           Elnora Morrison, MD 10/27/17 1705

## 2017-10-28 ENCOUNTER — Other Ambulatory Visit: Payer: Self-pay

## 2017-10-28 ENCOUNTER — Emergency Department (HOSPITAL_COMMUNITY)
Admission: EM | Admit: 2017-10-28 | Discharge: 2017-10-28 | Disposition: A | Discharge status: Home / Self Care | Payer: Self-pay | Attending: Emergency Medicine | Admitting: Emergency Medicine

## 2017-10-28 ENCOUNTER — Encounter (HOSPITAL_COMMUNITY): Payer: Self-pay | Admitting: Emergency Medicine

## 2017-10-28 DIAGNOSIS — K0889 Other specified disorders of teeth and supporting structures: Secondary | ICD-10-CM | POA: Insufficient documentation

## 2017-10-28 DIAGNOSIS — J020 Streptococcal pharyngitis: Secondary | ICD-10-CM | POA: Insufficient documentation

## 2017-10-28 DIAGNOSIS — J45909 Unspecified asthma, uncomplicated: Secondary | ICD-10-CM | POA: Insufficient documentation

## 2017-10-28 LAB — GROUP A STREP BY PCR: Group A Strep by PCR: DETECTED — AB

## 2017-10-28 MED ORDER — AMOXICILLIN 500 MG PO CAPS: 500.0000 mg | ORAL_CAPSULE | Freq: Three times a day (TID) | ORAL | 0 refills | Status: AC

## 2017-10-28 MED ORDER — PREDNISONE 20 MG PO TABS
40.0000 mg | ORAL_TABLET | Freq: Once | ORAL | Status: AC
  Administered 2017-10-28: 40 mg via ORAL
  Filled 2017-10-28: qty 2

## 2017-10-28 MED ORDER — IBUPROFEN 400 MG PO TABS
600.0000 mg | ORAL_TABLET | Freq: Once | ORAL | Status: AC
  Administered 2017-10-28: 600 mg via ORAL
  Filled 2017-10-28: qty 1

## 2017-10-28 MED ORDER — PREDNISONE 20 MG PO TABS: 40.0000 mg | ORAL_TABLET | Freq: Every day | ORAL | 0 refills | Status: AC

## 2017-10-28 NOTE — ED Triage Notes (Signed)
Pt to ER for right lower dental pain, states unable to afford dentist, was seen here for the same yesterday.

## 2017-10-28 NOTE — ED Provider Notes (Signed)
[facility] EMERGENCY DEPARTMENT Provider Note   CSN: [id number] Arrival date & time: 10/28/17  4098     History   Chief Complaint Chief Complaint  Patient presents with  . Dental Pain    HPI Marcia Pham is a 37 y.o. female who presents today for evaluation of dental pain.  She was here yesterday for the same.  She was given a dental block, antibiotics, and dental referral.  She reports today that her pain is now gone away from her tooth and is now on her throat.  She says that it hurts to swallow.  Denies any voice changes or drooling.  She has not taken any ibuprofen or Tylenol so far today.  HPI  Past Medical History:  Diagnosis Date  . Asthma   . Migraines   . Shingles     Patient Active Problem List   Diagnosis Date Noted  . Cluster B personality disorder ([place]) 07/14/2017    Past Surgical History:  Procedure Laterality Date  . ARM SURGERY       OB History   None      Home Medications    Prior to Admission medications   Medication Sig Start Date End Date Taking? Authorizing Provider  acetaminophen (TYLENOL) 325 MG tablet Take 650 mg by mouth every 6 (six) hours as needed for mild pain.    [provider]  albuterol (PROVENTIL HFA;VENTOLIN HFA) 108 (90 BASE) MCG/ACT inhaler Inhale 1-2 puffs into the lungs every 6 (six) hours as needed for wheezing or shortness of breath. Patient not taking: Reported on 10/27/2017 03/07/15   Anne Ng, PA-C  amoxicillin (AMOXIL) 500 MG capsule Take 1 capsule (500 mg total) by mouth 3 (three) times daily for 10 days. 10/28/17 11/07/17  Lorin Glass, PA-C  naproxen (NAPROSYN) 500 MG tablet Take 1 tablet (500 mg total) by mouth 2 (two) times daily as needed. 10/27/17   Elnora Morrison, MD  predniSONE (DELTASONE) 20 MG tablet Take 2 tablets (40 mg total) by mouth daily for 3 days. 10/28/17 10/31/17  Lorin Glass, PA-C    Family History History reviewed. No pertinent family  history.  Social History Social History   Tobacco Use  . Smoking status: Never Smoker  . Smokeless tobacco: Never Used  Substance Use Topics  . Alcohol use: No    Comment: "once a month"  . Drug use: No     Allergies   Patient has no known allergies.   Review of Systems Review of Systems  HENT: Positive for dental problem and sore throat. Negative for ear pain and trouble swallowing.   Respiratory: Negative for chest tightness and shortness of breath.   All other systems reviewed and are negative.    Physical Exam Updated Vital Signs BP (!) 128/96 (BP Location: Right Arm)   Pulse 95   Temp 99.1 F (37.3 C) (Oral)   Resp 18   LMP 10/27/2017   SpO2 100%   Physical Exam  Constitutional: She appears well-developed. No distress.  HENT:  Head: Normocephalic and atraumatic.  Mouth/Throat: Uvula is midline and mucous membranes are normal. No trismus in the jaw. Abnormal dentition (Generally poor state of dentition.  ). No dental abscesses or uvula swelling. Posterior oropharyngeal erythema present. Tonsils are 2+ on the right. Tonsils are 2+ on the left. Tonsillar exudate.  Neck: Normal range of motion. Neck supple.  Cardiovascular: Normal rate.  Pulmonary/Chest: Effort normal. No stridor. No respiratory distress. She has no wheezes.  Lymphadenopathy:    She has cervical adenopathy (Mild bilaterally).  Neurological: She is alert.  Skin: She is not diaphoretic.  Nursing note and vitals reviewed.    ED Treatments / Results  Labs (all labs ordered are listed, but only abnormal results are displayed) Labs Reviewed  GROUP A STREP BY PCR - Abnormal; Notable for the following components:      Result Value   Group A Strep by PCR DETECTED (*)    All other components within normal limits    EKG None  Radiology No results found.  Procedures Procedures (including critical care time)  Medications Ordered in ED Medications  ibuprofen (ADVIL,MOTRIN) tablet 600 mg  (600 mg Oral Given 10/28/17 0919)  predniSONE (DELTASONE) tablet 40 mg (40 mg Oral Given 10/28/17 1044)     Initial Impression / Assessment and Plan / ED Course  I have reviewed the triage vital signs and the nursing notes.  Pertinent labs & imaging results that were available during my care of the patient were reviewed by me and considered in my medical decision making (see chart for details).    Pt  with tonsillar exudate, cervical lymphadenopathy, & dysphagia; diagnosis of strep. Treated in the Ed with steroids, NSAIDs, Pain medication.  Has not yet filled her amoxicillin for her dental infection.  Amoxicillin will also provide strep coverage, however will need 10 days versus the previously prescribed 7 days.  Gave her new prescription along with prescription for steroids.  Presentation non concerning for PTA or infxn spread to soft tissue. No trismus or uvula deviation. Specific return precautions discussed. Pt able to drink water in ED without difficulty with intact air way. Recommended PCP follow up, and dental follow-up.    Final Clinical Impressions(s) / ED Diagnoses   Final diagnoses:  Strep throat  Pain, dental    ED Discharge Orders         Ordered    amoxicillin (AMOXIL) 500 MG capsule  3 times daily     10/28/17 1018    predniSONE (DELTASONE) 20 MG tablet  Daily     10/28/17 1018           Lorin Glass, [place] 10/28/17 1745    Isla Pence, MD 10/31/17 1323

## 2017-10-28 NOTE — Discharge Instructions (Addendum)
The antibiotics used to treat your dental infection will also treat strep throat.  The prescription that you were given yesterday was only for 7 days which is adequate for dental infections, however you need 10 days worth of antibiotics for strep throat, therefore I have given you a new prescription.  Please do not fill your amoxicillin prescription from yesterday, only your one for today.    Today you were given prednisone while in the emergency room, please do not take another dose until tomorrow morning.  Yesterday you are given a prescription for naproxen which will help with your pain.  Please do not take ibuprofen while taking naproxen.  You may take Tylenol.  Please take Tylenol (acetaminophen) to relieve your pain.  You may take tylenol, up to 1,000 mg (two extra strength pills).  Do not take more than 3,000 mg tylenol in a 24 hour period.  Please check all medication labels as many medications such as pain and cold medications may contain tylenol. Please do not drink alcohol while taking this medication.   I have given you a prescription for steroids today.  Some common side effects include feelings of extra energy, feeling warm, increased appetite, and stomach upset.  If you are diabetic your sugars may run higher than usual.

## 2018-01-10 IMAGING — DX DG SHOULDER 2+V*R*
3 series · 3 of 3 positions shown · non-contrast
Comparison: None.

CLINICAL DATA: Pain

EXAM:
RIGHT SHOULDER - 2+ VIEW

[shoulder grashey]
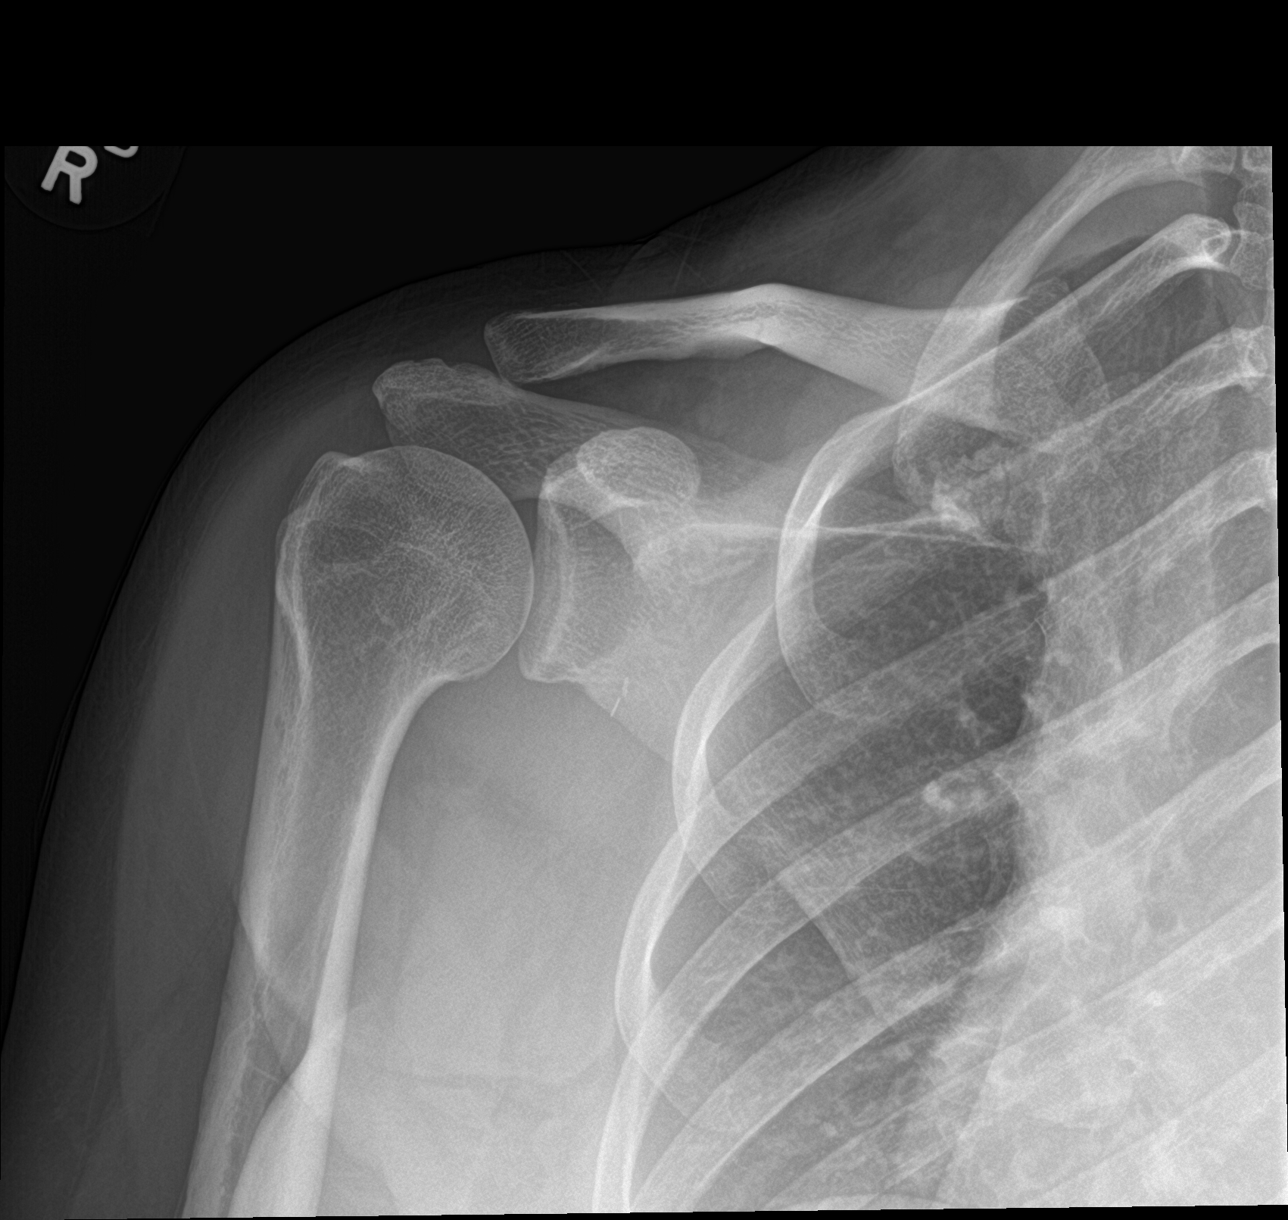

[shoulder y view]
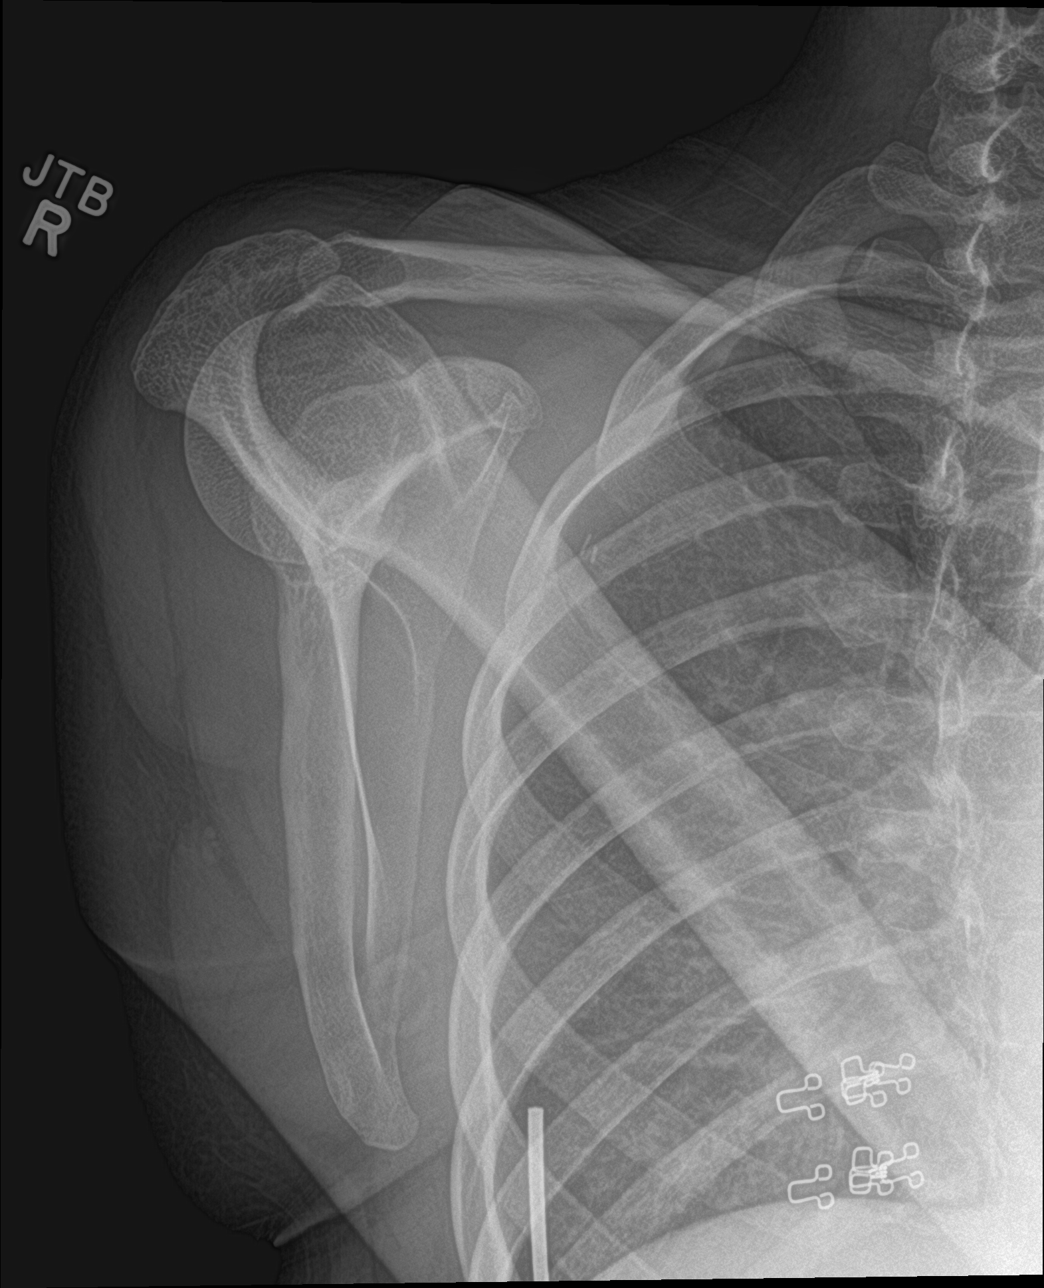

[shoulder axillary]
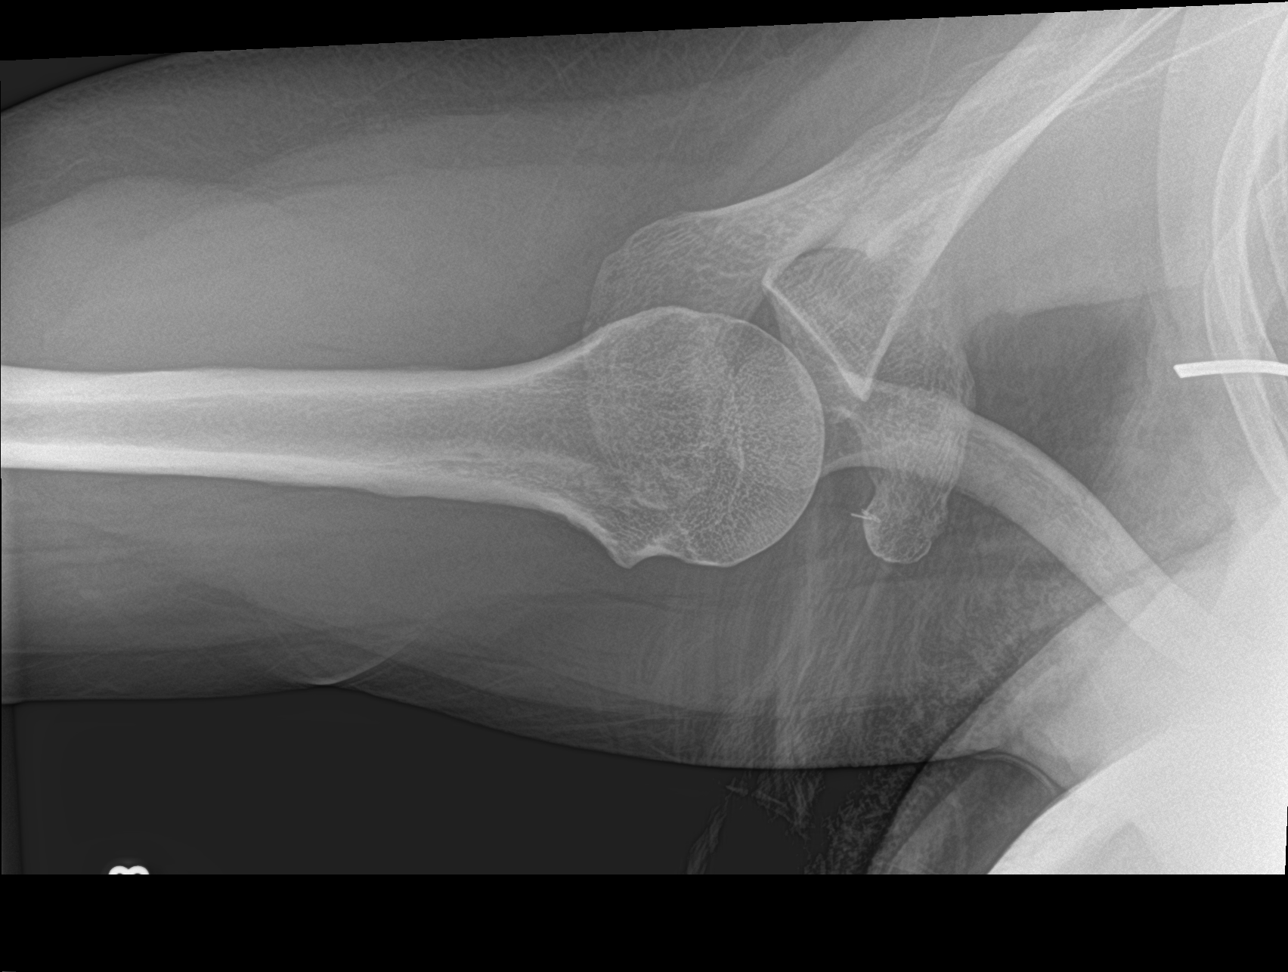

[3 of 3 positions shown; findings below may reference images not displayed]

FINDINGS: Frontal, Y scapular, and axillary images were obtained. There is no
evident fracture or dislocation. The joint spaces appear normal. No
erosive change. Visualized right lung is clear. There are surgical
clips overlying the inferior, lateral right scapula.
IMPRESSION: No fracture or dislocation.  No evident arthropathy.

## 2018-01-17 ENCOUNTER — Other Ambulatory Visit: Payer: Self-pay

## 2018-01-17 ENCOUNTER — Emergency Department (HOSPITAL_COMMUNITY)
Admission: EM | Admit: 2018-01-17 | Discharge: 2018-01-17 | Disposition: A | Discharge status: Home / Self Care | Payer: Self-pay | Attending: Emergency Medicine | Admitting: Emergency Medicine

## 2018-01-17 ENCOUNTER — Encounter (HOSPITAL_COMMUNITY): Payer: Self-pay | Admitting: Emergency Medicine

## 2018-01-17 DIAGNOSIS — N63 Unspecified lump in unspecified breast: Secondary | ICD-10-CM

## 2018-01-17 DIAGNOSIS — J45909 Unspecified asthma, uncomplicated: Secondary | ICD-10-CM | POA: Insufficient documentation

## 2018-01-17 DIAGNOSIS — N6310 Unspecified lump in the right breast, unspecified quadrant: Secondary | ICD-10-CM | POA: Insufficient documentation

## 2018-01-17 MED ORDER — CEPHALEXIN 500 MG PO CAPS: 500.0000 mg | ORAL_CAPSULE | Freq: Four times a day (QID) | ORAL | 0 refills | Status: DC

## 2018-01-17 NOTE — ED Provider Notes (Signed)
[facility] EMERGENCY DEPARTMENT Provider Note   CSN: [id number] Arrival date & time: 01/17/18  9211     History   Chief Complaint Chief Complaint  Patient presents with  . Breast Mass    HPI Marcia Pham is a 37 y.o. female presenting for concern of right breast mass that she first noticed last night.  Patient states that the area is nontender and she has never noticed it before.  Denies color change or discharge from the right breast.  Patient denies fever or pain to the area.  Patient denies change in mass since first noticing it last night.  Patient states that both her mother and her sister have been diagnosed with breast cancer.  Patient denies any and all other symptoms at this time.  Patient does not have primary care provider.  HPI  Past Medical History:  Diagnosis Date  . Asthma   . Migraines   . Shingles     Patient Active Problem List   Diagnosis Date Noted  . Cluster B personality disorder ([place]) 07/14/2017    Past Surgical History:  Procedure Laterality Date  . ARM SURGERY       OB History   None      Home Medications    Prior to Admission medications   Medication Sig Start Date End Date Taking? Authorizing Provider  acetaminophen (TYLENOL) 325 MG tablet Take 650 mg by mouth every 6 (six) hours as needed for mild pain.    [provider]  albuterol (PROVENTIL HFA;VENTOLIN HFA) 108 (90 BASE) MCG/ACT inhaler Inhale 1-2 puffs into the lungs every 6 (six) hours as needed for wheezing or shortness of breath. Patient not taking: Reported on 10/27/2017 03/07/15   Sam, Olivia Canter, PA-C  cephALEXin (KEFLEX) 500 MG capsule Take 1 capsule (500 mg total) by mouth 4 (four) times daily for 10 days. 01/17/18 01/27/18  Nuala Alpha A, PA-C  naproxen (NAPROSYN) 500 MG tablet Take 1 tablet (500 mg total) by mouth 2 (two) times daily as needed. 10/27/17   Elnora Morrison, MD    Family History No family history on file.  Social  History Social History   Tobacco Use  . Smoking status: Never Smoker  . Smokeless tobacco: Never Used  Substance Use Topics  . Alcohol use: No    Comment: "once a month"  . Drug use: No     Allergies   Patient has no known allergies.   Review of Systems Review of Systems  Constitutional: Negative.  Negative for chills and fever.  Musculoskeletal: Negative.  Negative for arthralgias and myalgias.  Skin: Negative for color change and rash.       Right breast mass   Physical Exam Updated Vital Signs BP 130/81   Pulse 70   Temp 97.8 F (36.6 C) (Oral)   Resp 16   Ht 5' (1.524 m)   Wt 66.7 kg   SpO2 100%   BMI 28.71 kg/m   Physical Exam  Constitutional: She appears well-developed and well-nourished. No distress.  HENT:  Head: Normocephalic and atraumatic.  Right Ear: External ear normal.  Left Ear: External ear normal.  Nose: Nose normal.  Eyes: Pupils are equal, round, and reactive to light. EOM are normal.  Neck: Trachea normal and normal range of motion. No tracheal deviation present.  Pulmonary/Chest: Effort normal. No respiratory distress. She exhibits no tenderness, no crepitus and no deformity. Right breast exhibits mass and skin change. Right breast exhibits no inverted nipple, no  nipple discharge and no tenderness.  Breast examination chaperoned by [facility].  Patient with 1.5 cm firm, nontender, nonfluctuant non-mobile mass inferior to right nipple.  Mild erythema overlying the mass.  No discharge present.  Abdominal: Soft. There is no tenderness. There is no rebound and no guarding.  Musculoskeletal: Normal range of motion.  Neurological: She is alert. GCS eye subscore is 4. GCS verbal subscore is 5. GCS motor subscore is 6.  Speech is clear and goal oriented, follows commands Major Cranial nerves without deficit, no facial droop Moves extremities without ataxia, coordination intact Normal gait  Skin: Skin is warm and dry.  Psychiatric: She has a  normal mood and affect. Her behavior is normal.   ED Treatments / Results  Labs (all labs ordered are listed, but only abnormal results are displayed) Labs Reviewed - No data to display  EKG None  Radiology No results found.  Procedures Procedures (including critical care time)  Medications Ordered in ED Medications - No data to display   Initial Impression / Assessment and Plan / ED Course  I have reviewed the triage vital signs and the nursing notes.  Pertinent labs & imaging results that were available during my care of the patient were reviewed by me and considered in my medical decision making (see chart for details).  Clinical Course as of Jan 17 1299  Sun Jan 17, 2018  1111 Patient does not have primary care provider.  Will need a primary care provider to follow-up with the breast [place].  Social work consulted to find patient a primary care provider.   [BM]    Clinical Course User Index [BM] Deliah Boston, [place]   37 year old female with a family history of breast cancer presenting today for concern of right breast mass first discovered last night.  On examination patient has a firm, nonmobile, nontender, nonfluctuant 1.5 cm circular mass to the right breast, inferior to nipple.  There is no tenderness or warmth present however there is mild erythematous skin changes overlying the mass.  Do not suspect abscess today however due to possible cellulitis/infection patient will be covered with Keflex.  Patient has been seen by case management today to establish a primary care doctor, patient has been established with [facility].  Patient has been referred to the breast [place] for further evaluation of her right breast mass.  Differential diagnosis including but not limited to cyst, abscess, adenoma and cancer discussed with patient.  Patient states understanding that prompt follow-up with both her  primary care provider and the breast imaging center are extremely important for further evaluation of her breast mass.  Patient also states understanding to take Keflex as prescribed.  Patient is afebrile, not tachycardic, not hypotensive, well-appearing and in no acute distress.  At this time there does not appear to be any evidence of an acute emergency medical condition and the patient appears stable for discharge with appropriate outpatient follow up. Diagnosis was discussed with patient who verbalizes understanding of care plan and is agreeable to discharge. I have discussed return precautions with patient who verbalizes understanding of return precautions. Patient strongly encouraged to follow-up with their PCP. All questions answered.   Note: Portions of this report may have been transcribed using voice recognition software. Every effort was made to ensure accuracy; however, inadvertent computerized transcription errors may still be present.  Final Clinical Impressions(s) / ED Diagnoses   Final diagnoses:  Breast mass  ED Discharge Orders         Ordered    cephALEXin (KEFLEX) 500 MG capsule  4 times daily     01/17/18 1254           Gari Crown 01/17/18 1306    Davonna Belling, MD 01/17/18 1554

## 2018-01-17 NOTE — Care Management (Signed)
ED CM consulted concerning assistance with OP f/u for breast mass. CM met with patient at bedside to discuss follow up option at the [facility]. Explained ED CM will contact CM at the clinic to schedule a follow up, verified patient's contact information. CM sent referral to clinic and provided patient with ED CM and and Clinic's CM contact information. Reviewed transitions of care plan with patient, clinic CM will contact patient to schedule a follow up appointment, verbalized understanding teach back done. CM will follow up to ensure f/u is schedule. Updated EDP

## 2018-01-17 NOTE — ED Notes (Signed)
Case manager at bedside 

## 2018-01-17 NOTE — Discharge Instructions (Addendum)
You have been diagnosed today with Breat Mass.  At this time there does not appear to be the presence of an emergent medical condition, however there is always the potential for conditions to worsen. Please read and follow the below instructions.  Please return to the Emergency Department immediately for any new or worsening symptoms or if your symptoms do not improve. Please be sure to follow up with your Primary Care Provider as soon as possible regarding your visit today; please call their office to schedule an appointment even if you are feeling better for a follow-up visit. Please follow the Case Manager's instructions that were discussed today, call [facility] to establish an appointment with your new primary care provider. Please schedule an appointment as soon as possible with the [place] for further evaluation of your right breast mass as soon as possible. Please take the antibiotic Keflex as prescribed.  Please read the additional information packets attached to your discharge summary.  Do not take your medicine if  develop an itchy rash, swelling in your mouth or lips, or difficulty breathing.

## 2018-01-17 NOTE — ED Triage Notes (Signed)
Pt has a non abscess like lump in her breast that she noticed this week. Pt concerned about going to get a mammography, states "I cannot afford one."

## 2018-01-17 NOTE — ED Notes (Signed)
See assessment from provider and secondary nurse.

## 2018-01-18 ENCOUNTER — Telehealth: Payer: Self-pay | Admitting: General Practice

## 2018-01-18 ENCOUNTER — Telehealth: Payer: Self-pay

## 2018-01-18 NOTE — Telephone Encounter (Signed)
Call returned to patient and scheduled her an appointment for 01/19/18 @ 1310 @ [facility].  Provided her with the address for the clinic.  Update provided to Laurena Slimmer, RN CM

## 2018-01-18 NOTE — Telephone Encounter (Signed)
Patient called back to speak with you. Please follow up.

## 2018-01-18 NOTE — Telephone Encounter (Signed)
Message received from Laurena Slimmer, RN CM requesting a hospital follow up appointment for the patient at [facility] as soon as possible.    Message received from the patient requesting a call back to schedule an appointment.   Call returned to the patient # [phone] and a voicemail message was left requesting a call back to this CM # [phone]

## 2018-01-19 ENCOUNTER — Encounter: Payer: Self-pay | Admitting: Family Medicine

## 2018-01-19 ENCOUNTER — Ambulatory Visit (INDEPENDENT_AMBULATORY_CARE_PROVIDER_SITE_OTHER): Payer: Self-pay | Admitting: Family Medicine

## 2018-01-19 VITALS — BP 124/78 | HR 73 | Temp 98.1°F | Resp 17 | Ht 62.0 in | Wt 152.6 lb

## 2018-01-19 DIAGNOSIS — Z803 Family history of malignant neoplasm of breast: Secondary | ICD-10-CM

## 2018-01-19 DIAGNOSIS — N6310 Unspecified lump in the right breast, unspecified quadrant: Secondary | ICD-10-CM

## 2018-01-19 DIAGNOSIS — Z1239 Encounter for other screening for malignant neoplasm of breast: Secondary | ICD-10-CM

## 2018-01-19 NOTE — Progress Notes (Signed)
Marcia Pham, is a 37 y.o. female  FVC:[id number]  MBW:[id number]  DOB - [birth year]  CC:  Chief Complaint  Patient presents with  . Establish Care  . Follow-up    ED 11/3: R breast mass. has finished the antibiotic but states that the mass hasn't decreased in size.       HPI: Marcia Pham is a 37 y.o. female is here today to establish care.   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    Today's visit:  Marcia Pham presents for ED follow-up. Patient presented to [facility] ER for evaluation of right breast mass. She was treated for a breast abscess and reports today that she completed medication without improvement of mass. She is concerned for breast cancer as her mother died of breast cancer, younger sister is in remission from breast cancer, and maternal grandmother died of breast cancer. She has no history of an abnormal pap and has had a normal mammogram in the past. She denies any other medical concerns. Patient denies new headaches, chest pain, abdominal pain, nausea, new weakness , numbness or tingling, SOB, edema, worrisome cough, suicidal ideations, or homicidal ideations.  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   Pertinent family medical history: family history includes Breast cancer in her mother and sister.   No Known Allergies  Social History   Socioeconomic History  . Marital status: Single    Spouse name: Not on file  . Number of children: Not on file  . Years of education: Not on file  . Highest education level: Not on file  Occupational History  . Not on file  Social Needs  . Financial resource strain: Not on file  . Food insecurity:    Worry: Not on file    Inability: Not on file  . Transportation needs:    Medical: Not on file    Non-medical: Not on file  Tobacco Use  . Smoking status: Never Smoker  . Smokeless tobacco: Never Used  Substance and Sexual  Activity  . Alcohol use: No    Comment: "once a month"  . Drug use: No  . Sexual activity: Yes  Lifestyle  . Physical activity:    Days per week: Not on file    Minutes per session: Not on file  . Stress: Not on file  Relationships  . Social connections:    Talks on phone: Not on file    Gets together: Not on file    Attends religious service: Not on file    Active member of club or organization: Not on file    Attends meetings of clubs or organizations: Not on file    Relationship status: Not on file  . Intimate partner violence:    Fear of current or ex partner: Not on file    Emotionally abused: Not on file    Physically abused: Not on file    Forced sexual activity: Not on file  Other Topics Concern  . Not on file  Social History Narrative  . Not on file    Review of Systems: Constitutional: Negative for fever, chills, diaphoresis, activity change, appetite change and fatigue. Eyes: Negative for pain, discharge, redness, itching and visual disturbance. Respiratory: Negative for cough, choking, chest tightness, shortness of breath, wheezing and stridor.  Cardiovascular: Negative for chest pain, palpitations and leg swelling. Gastrointestinal: Negative for abdominal distention. Musculoskeletal: Negative for back pain, joint swelling, arthralgia and gait  problem. Neurological: Negative for dizziness, tremors, seizures, syncope, facial asymmetry, speech difficulty, weakness, light-headedness, numbness and headaches.  Psychiatric/Behavioral: Negative for hallucinations, behavioral problems, confusion, dysphoric mood, decreased concentration and agitation.    Objective:   Vitals:   01/19/18 1511  BP: 124/78  Pulse: 73  Resp: 17  Temp: 98.1 F (36.7 C)  SpO2: 98%    BP Readings from Last 3 Encounters:  01/19/18 124/78  01/17/18 130/81  10/28/17 (!) 128/96    Filed Weights   01/19/18 1511  Weight: 152 lb 9.6 oz (69.2 kg)      Physical Exam: Constitutional:  Patient appears well-developed and well-nourished. No distress. HENT: Normocephalic, atraumatic, External right and left ear normal. Oropharynx is clear and moist.  Eyes: Conjunctivae and EOM are normal. PERRLA, no scleral icterus. Neck: Normal ROM. Neck supple. No JVD. No tracheal deviation. No thyromegaly. CVS: RRR, S1/S2 +, no murmurs, no gallops, no carotid bruit.  Pulmonary: Effort and breath sounds normal, no stridor, rhonchi, wheezes, rales. Chest: Breasts are symmetric. Palpable mass noted 6 o'clock position right breast. Negative for cutaneous changes, nipple inversion or discharge. No axillary lymphadenopathy.  Abdominal: Soft. BS +, no distension, tenderness, rebound or guarding.  Musculoskeletal: Normal range of motion. No edema and no tenderness.  Neuro: Alert. Normal muscle tone coordination. Normal gait. BUE and BLE strength 5/5.  Bilateral hand grips symmetrical. Skin: multiple nevi and skin and raised skin tags noted on torso and various region of body. Psychiatric: Normal mood and affect. Behavior, judgment, thought content normal.  Lab Results (prior encounters)  Lab Results  Component Value Date   WBC 9.8 07/13/2017   HGB 14.2 07/13/2017   HCT 41.2 07/13/2017   MCV 86.4 07/13/2017   PLT 446 (H) 07/13/2017   Lab Results  Component Value Date   CREATININE 0.62 07/13/2017   BUN 12 07/13/2017   NA 139 07/13/2017   K 3.1 (L) 07/13/2017   CL 106 07/13/2017   CO2 22 07/13/2017    No results found for: HGBA1C  No results found for: CHOL, TRIG, HDL, CHOLHDL, VLDL, LDLCALC      Assessment and plan:  1. Breast mass, right, uninsured. Provided financial assistance paperwork to have breast screenings completed through the [facility] program. Patient is at high risk for BCA given family history - US BREAST COMPLETE UNI RIGHT INC AXILLA; Future  2. Breast cancer screening, high risk patient - MM Digital Screening; Future  3. Family history of breast cancer in first degree  relative   Orders Placed This Encounter  Procedures  . MM Digital Screening    Standing Status:   Future    Standing Expiration Date:   03/22/2019    Order Specific Question:   Reason for Exam (SYMPTOM  OR DIAGNOSIS REQUIRED)    Answer:   routine screen breast cancer    Order Specific Question:   Is the patient pregnant?    Answer:   No    Order Specific Question:   Preferred imaging location?    Answer:   [facility]  . US BREAST COMPLETE UNI RIGHT INC AXILLA    Standing Status:   Future    Standing Expiration Date:   03/22/2019    Order Specific Question:   Reason for Exam (SYMPTOM  OR DIAGNOSIS REQUIRED)    Answer:   mass noted 6 o'clock position right breast. postive first degree relatives hx of breast cancer    Order Specific Question:   Preferred imaging location?  Answer:   [facility]    Order Specific Question:   Call Results- Best Contact Number?    Answer:   [phone]   A total of 38minutes spent, greater than 50 % of this time was spent counseling and coordination of care.   4-6 week for a complete physical exam, including fasting labs.  The patient was given clear instructions to go to ER or return to medical center if symptoms don't improve, worsen or new problems develop. The patient verbalized understanding. The patient was advised  to call and obtain lab results if they haven't heard anything from out office within 7-10 business days.  Molli Barrows, FNP Primary Care at [facility] [address] [place] [address] [phone]: [phone]    This note has been created with Dragon speech recognition software and Engineer, materials. Any transcriptional errors are unintentional.

## 2018-01-19 NOTE — Patient Instructions (Addendum)
You are will be notified regarding when your mammogram is scheduled.   Thank you for choosing Primary Care at [facility] for your medical home!    Marcia Pham was seen by Molli Barrows, FNP today.   Marcia Pham's primary care doctor is Scot Jun, FNP.   For the best care possible,  you should try to see Molli Barrows, FNP-C  whenever you come to clinic.   We look forward to seeing you again soon!  If you have any questions about your visit today,  please call us at [phone]  Or feel free to reach your provider via Vader.     Mammogram A mammogram is an X-ray of the breasts that is done to check for changes that are not normal. This test can screen for and find any changes that may suggest breast cancer. This test can also help to find other changes and variations in the breast. What happens before the procedure?  Have this test done about 1-2 weeks after your period. This is usually when your breasts are the least tender.  If you are visiting a new doctor or clinic, send any past mammogram images to your new doctor's office.  Wash your breasts and under your arms the day of the test.  Do not use deodorants, perfumes, lotions, or powders on the day of the test.  Take off any jewelry from your neck.  Wear clothes that you can change into and out of easily. What happens during the procedure?  You will undress from the waist up. You will put on a gown.  You will stand in front of the X-ray machine.  Each breast will be placed between two plastic or glass plates. The plates will press down on your breast for a few seconds. Try to stay as relaxed as possible. This does not cause any harm to your breasts. Any discomfort you feel will be very brief.  X-rays will be taken from different angles of each breast. The procedure may vary among doctors and hospitals. What happens after the procedure?  The mammogram will be looked at by a specialist  (radiologist).  You may need to do certain parts of the test again. This depends on the quality of the images.  Ask when your test results will be ready. Make sure you get your test results.  You may go back to your normal activities. This information is not intended to replace advice given to you by your health care provider. Make sure you discuss any questions you have with your health care provider. Document Released: 05/30/2008 Document Revised: 08/09/2015 Document Reviewed: 05/12/2014 Elsevier Interactive Patient Education  [employer].

## 2018-01-20 ENCOUNTER — Other Ambulatory Visit: Payer: Self-pay | Admitting: Family Medicine

## 2018-01-20 ENCOUNTER — Other Ambulatory Visit (HOSPITAL_COMMUNITY): Payer: Self-pay | Admitting: *Deleted

## 2018-01-20 DIAGNOSIS — Q8501 Neurofibromatosis, type 1: Secondary | ICD-10-CM | POA: Insufficient documentation

## 2018-01-20 DIAGNOSIS — N6310 Unspecified lump in the right breast, unspecified quadrant: Secondary | ICD-10-CM

## 2018-01-20 DIAGNOSIS — Z803 Family history of malignant neoplasm of breast: Secondary | ICD-10-CM | POA: Insufficient documentation

## 2018-01-27 ENCOUNTER — Ambulatory Visit (INDEPENDENT_AMBULATORY_CARE_PROVIDER_SITE_OTHER): Payer: Self-pay | Admitting: Family Medicine

## 2018-01-27 ENCOUNTER — Encounter: Payer: Self-pay | Admitting: Family Medicine

## 2018-01-27 VITALS — BP 127/84 | HR 61 | Temp 98.3°F | Resp 17 | Ht 62.0 in | Wt 145.0 lb

## 2018-01-27 DIAGNOSIS — R51 Headache: Secondary | ICD-10-CM

## 2018-01-27 DIAGNOSIS — Q8501 Neurofibromatosis, type 1: Secondary | ICD-10-CM

## 2018-01-27 DIAGNOSIS — G8929 Other chronic pain: Secondary | ICD-10-CM

## 2018-01-27 DIAGNOSIS — Z131 Encounter for screening for diabetes mellitus: Secondary | ICD-10-CM

## 2018-01-27 DIAGNOSIS — Z1329 Encounter for screening for other suspected endocrine disorder: Secondary | ICD-10-CM

## 2018-01-27 DIAGNOSIS — Z130 Encounter for screening for diseases of the blood and blood-forming organs and certain disorders involving the immune mechanism: Secondary | ICD-10-CM

## 2018-01-27 DIAGNOSIS — F330 Major depressive disorder, recurrent, mild: Secondary | ICD-10-CM

## 2018-01-27 DIAGNOSIS — Z13220 Encounter for screening for lipoid disorders: Secondary | ICD-10-CM

## 2018-01-27 DIAGNOSIS — Z711 Person with feared health complaint in whom no diagnosis is made: Secondary | ICD-10-CM

## 2018-01-27 DIAGNOSIS — Z0000 Encounter for general adult medical examination without abnormal findings: Secondary | ICD-10-CM

## 2018-01-27 DIAGNOSIS — R519 Headache, unspecified: Secondary | ICD-10-CM

## 2018-01-27 MED ORDER — BUTALBITAL-APAP-CAFFEINE 50-325-40 MG PO TABS: 1.0000 | ORAL_TABLET | Freq: Four times a day (QID) | ORAL | 0 refills | Status: AC | PRN

## 2018-01-27 MED ORDER — SERTRALINE HCL 25 MG PO TABS: 25.0000 mg | ORAL_TABLET | Freq: Every day | ORAL | 2 refills | Status: AC

## 2018-01-27 NOTE — Patient Instructions (Signed)

## 2018-01-27 NOTE — Progress Notes (Signed)
Patient ID: Marcia Pham, female    DOB: 15-Mar-1981, 37 y.o.   MRN: [id number]  PCP: Scot Jun, FNP  Chief Complaint  Patient presents with  . Annual Exam    had a soda this morning    Subjective:  HPI  Marcia Pham is a 37 y.o. female presents annual physical exam.  Yocheved Howardhas Cluster B personality disorder ([place]); Neurofibromatosis, type 1 ([place]); Breast mass, right; and Family history of breast cancer in first degree relative on their problem list.  No recent medical follow-up until patient recently established here. She reports a history of depression since early teens and was prescribe Zoloft throughout teenage years stopped due to no health coverage. Wishes to restart. Denies SI or HI. Reports chronic dysphoric mood. Never suicidal or any prior suicidal attempts.  Concern regarding chronic headaches for which she reports a prior diagnosis of migraines. Reports an average of 5 HA per month with an Aura. When headaches are intense, she experiences nausea and vomiting. No prior treatment for headaches except OTC and presenting to the ER.  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 No recent follow-up or treatment. This genetic disorder affects both her mother and sister.   Sexually active and would like STD testing.   No history of chest pain, shortness of breath, numbness of tingling, new weakness, edema, or worrisome cough.  Social History   Socioeconomic History  . Marital status: Single    Spouse name: Not on file  . Number of children: Not on file  . Years of education: Not on file  . Highest education level: Not on file  Occupational History  . Not on file  Social Needs  . Financial resource strain: Not on file  . Food insecurity:    Worry: Not on file    Inability: Not on file  . Transportation needs:    Medical: Not on file    Non-medical: Not on file  Tobacco Use  . Smoking status: Never Smoker  . Smokeless tobacco: Never Used   Substance and Sexual Activity  . Alcohol use: No    Comment: "once a month"  . Drug use: No  . Sexual activity: Yes  Lifestyle  . Physical activity:    Days per week: Not on file    Minutes per session: Not on file  . Stress: Not on file  Relationships  . Social connections:    Talks on phone: Not on file    Gets together: Not on file    Attends religious service: Not on file    Active member of club or organization: Not on file    Attends meetings of clubs or organizations: Not on file    Relationship status: Not on file  . Intimate partner violence:    Fear of current or ex partner: Not on file    Emotionally abused: Not on file    Physically abused: Not on file    Forced sexual activity: Not on file  Other Topics Concern  . Not on file  Social History Narrative  . Not on file    Family History  Problem Relation Age of Onset  . Breast cancer Mother   . Breast cancer Sister    Review of Systems  Pertinent negatives listed in HPI Patient Active Problem List   Diagnosis Date Noted  . Neurofibromatosis, type 1 ([place]) 01/20/2018  . Breast mass, right 01/20/2018  . Family history of breast cancer in first  degree relative 01/20/2018  . Cluster B personality disorder ([place]) 07/14/2017    No Known Allergies  Prior to Admission medications   Not on File    Past Medical, Surgical Family and Social History reviewed and updated.    Objective:   Today's Vitals   01/27/18 0848  BP: 127/84  Pulse: 61  Resp: 17  Temp: 98.3 F (36.8 C)  TempSrc: Oral  SpO2: 99%  Weight: 145 lb (65.8 kg)  Height: 5\' 2"  (1.575 m)    Wt Readings from Last 3 Encounters:  01/27/18 145 lb (65.8 kg)  01/19/18 152 lb 9.6 oz (69.2 kg)  01/17/18 147 lb (66.7 kg)     Physical Exam General appearance: alert, well developed, well nourished, cooperative and in no distress Head: Normocephalic, without obvious abnormality, atraumatic Respiratory: Respirations even and unlabored, normal  respiratory rate Heart: rate and rhythm normal. No gallop or murmurs noted on exam  Extremities: No gross deformities Skin: Skin color, texture, turgor normal. No rashes seen  Psych: Appropriate mood and affect. Neurologic: Mental status: Alert, oriented to person, place, and time, thought content appropriate.   Assessment & Plan:  1. Concern about STD in female without diagnosis - HIV antibody (with reflex) - RPR - GC/Chlamydia Probe Amp([facility])  2. Annual physical exam -Age appropriate anticipatory guidance provided   3. Neurofibromatosis, type 1 ([place]) -pending financial assistance acceptance will refer to neurology   4. Chronic nonintractable headache, unspecified headache type, -trial Fioricet PRN for HA abortive therapy -if no improvement, will consider Topamax and or Sumatriptan as adjunctive therapy   5. Screening for thyroid disorder - Thyroid Panel With TSH  6. Screening for diabetes mellitus - Comprehensive metabolic panel - Hemoglobin A1c  7. Screening, lipid - Lipid panel  8. Screening for deficiency anemia - CBC with Differential   Orders Placed This Encounter  Procedures  . GC/Chlamydia Probe Amp([facility])    Order Specific Question:   Source    Answer:   screen urine STD  . HIV antibody (with reflex)  . RPR  . Comprehensive metabolic panel    Order Specific Question:   Has the patient fasted?    Answer:   No  . CBC with Differential  . Lipid panel    Order Specific Question:   Has the patient fasted?    Answer:   No  . Thyroid Panel With TSH  . Hemoglobin A1c    Meds ordered this encounter  Medications  . butalbital-acetaminophen-caffeine (FIORICET, ESGIC) 50-325-40 MG tablet    Sig: Take 1 tablet by mouth every 6 (six) hours as needed for headache.    Dispense:  60 tablet    Refill:  0  . sertraline (ZOLOFT) 25 MG tablet    Sig: Take 1 tablet (25 mg total) by mouth at bedtime.    Dispense:  30 tablet    Refill:  2    RTC: 6 weeks for  PAP and medication management     -The patient was given clear instructions to go to ER or return to medical center if symptoms do not improve, worsen or new problems develop. The patient verbalized understanding.    Molli Barrows, FNP Primary Care at [facility] [address] [place] [phone]: [phone]

## 2018-01-28 LAB — RPR: RPR Ser Ql: NONREACTIVE

## 2018-01-28 LAB — CBC WITH DIFFERENTIAL/PLATELET
BASOS ABS: 0.1 10*3/uL (ref 0.0–0.2)
Basos: 1 %
EOS (ABSOLUTE): 0 10*3/uL (ref 0.0–0.4)
EOS: 0 %
HEMATOCRIT: 32.4 % — AB (ref 34.0–46.6)
Hemoglobin: 10.7 g/dL — ABNORMAL LOW (ref 11.1–15.9)
IMMATURE GRANULOCYTES: 0 %
Immature Grans (Abs): 0 10*3/uL (ref 0.0–0.1)
LYMPHS ABS: 1.3 10*3/uL (ref 0.7–3.1)
Lymphs: 16 %
MCH: 28.8 pg (ref 26.6–33.0)
MCHC: 33 g/dL (ref 31.5–35.7)
MCV: 87 fL (ref 79–97)
MONOS ABS: 0.6 10*3/uL (ref 0.1–0.9)
Monocytes: 7 %
NEUTROS PCT: 76 %
Neutrophils Absolute: 6 10*3/uL (ref 1.4–7.0)
PLATELETS: 456 10*3/uL — AB (ref 150–450)
RBC: 3.71 x10E6/uL — AB (ref 3.77–5.28)
RDW: 12.1 % — AB (ref 12.3–15.4)
WBC: 7.9 10*3/uL (ref 3.4–10.8)

## 2018-01-28 LAB — THYROID PANEL WITH TSH
FREE THYROXINE INDEX: 3.6 (ref 1.2–4.9)
T3 UPTAKE RATIO: 32 % (ref 24–39)
T4, Total: 11.1 ug/dL (ref 4.5–12.0)
TSH: 0.506 u[IU]/mL (ref 0.450–4.500)

## 2018-01-28 LAB — COMPREHENSIVE METABOLIC PANEL
ALBUMIN: 4.9 g/dL (ref 3.5–5.5)
ALK PHOS: 76 IU/L (ref 39–117)
ALT: 11 IU/L (ref 0–32)
AST: 14 IU/L (ref 0–40)
Albumin/Globulin Ratio: 1.8 (ref 1.2–2.2)
BILIRUBIN TOTAL: 0.4 mg/dL (ref 0.0–1.2)
BUN / CREAT RATIO: 11 (ref 9–23)
BUN: 7 mg/dL (ref 6–20)
CHLORIDE: 103 mmol/L (ref 96–106)
CO2: 23 mmol/L (ref 20–29)
CREATININE: 0.66 mg/dL (ref 0.57–1.00)
Calcium: 9.4 mg/dL (ref 8.7–10.2)
GFR calc Af Amer: 130 mL/min/{1.73_m2} (ref >59)
GFR calc non Af Amer: 112 mL/min/{1.73_m2} (ref >59)
GLOBULIN, TOTAL: 2.8 g/dL (ref 1.5–4.5)
GLUCOSE: 68 mg/dL (ref 65–99)
Potassium: 3.1 mmol/L — ABNORMAL LOW (ref 3.5–5.2)
SODIUM: 142 mmol/L (ref 134–144)
Total Protein: 7.7 g/dL (ref 6.0–8.5)

## 2018-01-28 LAB — LIPID PANEL
CHOLESTEROL TOTAL: 299 mg/dL — AB (ref 100–199)
Chol/HDL Ratio: 5.3 ratio — ABNORMAL HIGH (ref 0.0–4.4)
HDL: 56 mg/dL (ref >39)
LDL Calculated: 231 mg/dL — ABNORMAL HIGH (ref 0–99)
TRIGLYCERIDES: 62 mg/dL (ref 0–149)
VLDL Cholesterol Cal: 12 mg/dL (ref 5–40)

## 2018-01-28 LAB — HIV ANTIBODY (ROUTINE TESTING W REFLEX): HIV Screen 4th Generation wRfx: NONREACTIVE

## 2018-01-28 LAB — HEMOGLOBIN A1C
ESTIMATED AVERAGE GLUCOSE: 85 mg/dL
Hgb A1c MFr Bld: 4.6 % — ABNORMAL LOW (ref 4.8–5.6)

## 2018-01-29 MED ORDER — FERROUS SULFATE 325 (65 FE) MG PO TABS: 325.0000 mg | ORAL_TABLET | Freq: Every day | ORAL | 3 refills | Status: AC

## 2018-01-29 MED FILL — FERROUS SULFATE 325 MG TAB: 325 (65 FE) | 30 days supply | Qty: 60 | Fill #0

## 2018-01-29 NOTE — Addendum Note (Signed)
Addended by: Scot Jun on: 01/29/2018 09:08 AM   Modules accepted: Orders

## 2018-01-31 LAB — GC/CHLAMYDIA PROBE AMP
Chlamydia trachomatis, NAA: NEGATIVE
Neisseria gonorrhoeae by PCR: NEGATIVE

## 2018-02-02 ENCOUNTER — Other Ambulatory Visit (HOSPITAL_COMMUNITY): Payer: Self-pay | Admitting: Obstetrics and Gynecology

## 2018-02-02 ENCOUNTER — Ambulatory Visit
Admission: RE | Admit: 2018-02-02 | Discharge: 2018-02-02 | Disposition: A | Discharge status: Home / Self Care | Payer: No Typology Code available for payment source | Source: Ambulatory Visit | Attending: Obstetrics and Gynecology | Admitting: Obstetrics and Gynecology

## 2018-02-02 ENCOUNTER — Encounter (HOSPITAL_COMMUNITY): Payer: Self-pay

## 2018-02-02 ENCOUNTER — Ambulatory Visit (HOSPITAL_COMMUNITY)
Admission: RE | Admit: 2018-02-02 | Discharge: 2018-02-02 | Disposition: A | Discharge status: Home / Self Care | Payer: Self-pay | Source: Ambulatory Visit | Attending: Obstetrics and Gynecology | Admitting: Obstetrics and Gynecology

## 2018-02-02 VITALS — BP 108/64 | Wt 147.0 lb

## 2018-02-02 DIAGNOSIS — N6310 Unspecified lump in the right breast, unspecified quadrant: Secondary | ICD-10-CM

## 2018-02-02 DIAGNOSIS — N6314 Unspecified lump in the right breast, lower inner quadrant: Secondary | ICD-10-CM

## 2018-02-02 DIAGNOSIS — Z01419 Encounter for gynecological examination (general) (routine) without abnormal findings: Secondary | ICD-10-CM

## 2018-02-02 HISTORY — DX: Anemia, unspecified: D64.9

## 2018-02-02 NOTE — Patient Instructions (Signed)
Explained breast self awareness with Marlow Baars. Let patient know B[facility]CP will cover Pap smears and HPV typing every 5 years unless has a history of abnormal Pap smears. Referred patient to the [facility] for a diagnostic mammogram. Appointment scheduled for Tuesday, February 02, 2018 at 1250. Patient aware of appointment and will be there. Let patient know will follow up with her within the next couple weeks with results of Pap smear by letter or phone. Marlow Baars verbalized understanding.  Jomarion Mish, Arvil [place], RN 12:17 PM

## 2018-02-02 NOTE — Progress Notes (Signed)
Complaints of a right breast lump x a few weeks that is painful. Patient states the pain comes and goes. Patient rates the pain at a 5 out of 10.  Pap Smear: Pap smear completed today. Last Pap smear was over four years ago at the [facility] and normal per patient. Per patient has no history of an abnormal Pap smear. No Pap smear results are in Epic.  Physical exam: Breasts Breasts symmetrical. No skin abnormalities bilateral breasts. No nipple retraction bilateral breasts. No nipple discharge bilateral breasts. No lymphadenopathy. No lumps palpated left breast. Palpated a lump within the right breast at 4 o'clock next to the areola. Complaints of tenderness when palpated lump. Referred patient to the [facility] for a diagnostic mammogram. Appointment scheduled for Tuesday, February 02, 2018 at 1250.        Pelvic/Bimanual   Ext Genitalia No lesions, no swelling and no discharge observed on external genitalia.         Vagina Vagina pink and normal texture. No lesions or discharge observed in vagina.          Cervix Cervix is present. Cervix pink and of normal texture. No discharge observed.     Uterus Uterus is present and palpable. Uterus in normal position and normal size.        Adnexae Bilateral ovaries present and palpable. No tenderness on palpation.         Rectovaginal No rectal exam completed today since patient had no rectal complaints. No skin abnormalities observed on exam.    Smoking History: Patient has never smoked.  Patient Navigation: Patient education provided. Access to services provided for patient through BCCCP program.   Breast and Cervical Cancer Risk Assessment: Patient has a family history of her mother, sister, and maternal grandmother having breast cancer. Patient has no known genetic mutations or history of radiation treatment to the chest before age 55. Patient has no history of cervical dysplasia,  immunocompromised, or DES exposure in-utero.  Risk Assessment    Risk Scores      02/02/2018   Last edited by: Armond Hang, LPN   5-year risk: 1.2 %   Lifetime risk: 23 %

## 2018-02-03 ENCOUNTER — Telehealth: Payer: Self-pay | Admitting: Family Medicine

## 2018-02-03 DIAGNOSIS — E785 Hyperlipidemia, unspecified: Secondary | ICD-10-CM

## 2018-02-03 DIAGNOSIS — E875 Hyperkalemia: Secondary | ICD-10-CM

## 2018-02-03 MED ORDER — POTASSIUM CHLORIDE CRYS ER 20 MEQ PO TBCR: 20.0000 meq | EXTENDED_RELEASE_TABLET | Freq: Every day | ORAL | 0 refills | Status: DC

## 2018-02-03 MED ORDER — POTASSIUM CHLORIDE CRYS ER 20 MEQ PO TBCR: 20.0000 meq | EXTENDED_RELEASE_TABLET | Freq: Every day | ORAL | 0 refills | Status: AC

## 2018-02-03 MED FILL — POTASSIUM CL ER 20 MEQ TAB: 20 | 5 days supply | Qty: 5 | Fill #0

## 2018-02-03 NOTE — Telephone Encounter (Signed)
Contact patient to advise hemoglobin is low and she will need to start oral  iron replacement which has been to her pharmacy. Take as directed. Cholesterol panel was elevated.  Uncertain if she was fasting when this level was checked.  I would like for her to return and have a repeat fasting lab panel the first week of December.  Please advise her not to eat or drink anything prior to her visit to have her lab work drawn.  If levels continue to be very elevated she will need to start statin therapy to reduce the risk of a cardiovascular. STD testing was negative.  Her potassium level is low which in review of medical record has been low.  I would also like to start her on oral replacement potassium tablets days.  We will also recheck a potassium level at her December visit.  [facility]

## 2018-02-03 NOTE — Telephone Encounter (Signed)
Left voice mail to call back 

## 2018-02-05 LAB — CYTOLOGY - PAP
Adequacy: ABSENT
DIAGNOSIS: NEGATIVE
HPV: NOT DETECTED

## 2018-02-10 ENCOUNTER — Encounter (HOSPITAL_COMMUNITY): Payer: Self-pay | Admitting: *Deleted

## 2018-02-17 NOTE — Telephone Encounter (Signed)
Left voice mail to call back 

## 2018-02-19 NOTE — Telephone Encounter (Signed)
Left voicemail to call back.  Mailed "unable to contact" letter.

## 2018-03-04 NOTE — Telephone Encounter (Signed)
Patient called in & we went over lab results & recommendations. Expressed understanding.

## 2018-03-11 ENCOUNTER — Ambulatory Visit: Payer: Self-pay | Admitting: Family Medicine

## 2018-04-13 ENCOUNTER — Encounter (HOSPITAL_COMMUNITY): Payer: Self-pay | Admitting: *Deleted

## 2018-04-13 NOTE — Progress Notes (Signed)
Letter mailed to patient with negative pap smear results. HPV was negative. Next pap smear due in five years. 

## 2018-05-09 ENCOUNTER — Other Ambulatory Visit: Payer: Self-pay

## 2018-05-09 ENCOUNTER — Encounter (HOSPITAL_COMMUNITY): Payer: Self-pay | Admitting: Emergency Medicine

## 2018-05-09 ENCOUNTER — Emergency Department (HOSPITAL_COMMUNITY)
Admission: EM | Admit: 2018-05-09 | Discharge: 2018-05-09 | Disposition: A | Discharge status: Home / Self Care | Payer: No Typology Code available for payment source | Attending: Emergency Medicine | Admitting: Emergency Medicine

## 2018-05-09 DIAGNOSIS — K0889 Other specified disorders of teeth and supporting structures: Secondary | ICD-10-CM | POA: Insufficient documentation

## 2018-05-09 DIAGNOSIS — K046 Periapical abscess with sinus: Secondary | ICD-10-CM | POA: Insufficient documentation

## 2018-05-09 DIAGNOSIS — K047 Periapical abscess without sinus: Secondary | ICD-10-CM

## 2018-05-09 DIAGNOSIS — Z79899 Other long term (current) drug therapy: Secondary | ICD-10-CM | POA: Insufficient documentation

## 2018-05-09 DIAGNOSIS — J45909 Unspecified asthma, uncomplicated: Secondary | ICD-10-CM | POA: Insufficient documentation

## 2018-05-09 DIAGNOSIS — R51 Headache: Secondary | ICD-10-CM | POA: Insufficient documentation

## 2018-05-09 MED ORDER — AMOXICILLIN 500 MG PO TABS: 500.0000 mg | ORAL_TABLET | Freq: Three times a day (TID) | ORAL | 0 refills | Status: AC

## 2018-05-09 MED ORDER — HYDROCODONE-ACETAMINOPHEN 5-325 MG PO TABS
2.0000 | ORAL_TABLET | Freq: Once | ORAL | Status: AC
  Administered 2018-05-09: 2 via ORAL
  Filled 2018-05-09: qty 2

## 2018-05-09 MED ORDER — IBUPROFEN 800 MG PO TABS
800.0000 mg | ORAL_TABLET | Freq: Once | ORAL | Status: AC
  Administered 2018-05-09: 800 mg via ORAL
  Filled 2018-05-09: qty 1

## 2018-05-09 MED ORDER — BUPIVACAINE HCL (PF) 0.5 % IJ SOLN
10.0000 mL | Freq: Once | INTRAMUSCULAR | Status: AC
  Administered 2018-05-09: 10 mL
  Filled 2018-05-09: qty 10

## 2018-05-09 MED ORDER — IBUPROFEN 800 MG PO TABS: 800.0000 mg | ORAL_TABLET | Freq: Three times a day (TID) | ORAL | 0 refills | Status: AC

## 2018-05-09 NOTE — ED Triage Notes (Signed)
Pt reports L upper dental pain x 4-5 days, denies difficulty swallowing or fever.  Pt also states, "I have a migraine, but I get them all the time." Pt denies changes to vision or weakness.

## 2018-05-09 NOTE — ED Provider Notes (Signed)
[facility] EMERGENCY DEPARTMENT Provider Note   CSN: [id number] Arrival date & time: 05/09/18  1139    History   Chief Complaint Chief Complaint  Patient presents with  . Dental Pain  . Headache    HPI Marcia Pham is a 38 y.o. female.     HPI Patient reports she has multiple bad teeth that are partially broken.  She reports however she started getting severe pain in the left upper jaw about 3 days ago.  She has been trying to take ibuprofen and get pain relief.  She reports it is getting very severe however and she was nearly in tears last night.  She reports that she has a job now and if she will months to have dental insurance.  Patient reports that she has a history of migraine headaches.  She reports she has them frequently.  She reports she has somewhat of a headache right now but she reports this is nothing compared to the pain that her tooth is giving her.  She reports she would "rather have a migraine and this toothache".  Patient requests dental block so she can eat something today. Past Medical History:  Diagnosis Date  . Anemia   . Asthma   . Migraines   . Shingles     Patient Active Problem List   Diagnosis Date Noted  . 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 Breast mass, right 01/20/2018  . Family history of breast cancer in first degree relative 01/20/2018  . 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 ARM SURGERY       OB History    Gravida  1   Para      Term      Preterm      AB  1   Living        SAB      TAB  1   Ectopic      Multiple      Live Births               Home Medications    Prior to Admission medications   Medication Sig Start Date End Date Taking? Authorizing Provider  amoxicillin (AMOXIL) 500 MG tablet Take 1 tablet (500 mg total) by mouth 3 (three) times daily with meals. 05/09/18   Charlesetta Shanks, MD    butalbital-acetaminophen-caffeine (FIORICET, ESGIC) [phone] MG tablet Take 1 tablet by mouth every 6 (six) hours as needed for headache. 01/27/18 01/27/19  Scot Jun, FNP  ferrous sulfate (FERROUSUL) 325 (65 FE) MG tablet Take 1 tablet (325 mg total) by mouth daily with breakfast. 01/29/18   Scot Jun, FNP  ibuprofen (ADVIL,MOTRIN) 800 MG tablet Take 1 tablet (800 mg total) by mouth 3 (three) times daily. 05/09/18   Charlesetta Shanks, MD  potassium chloride SA (K-DUR,KLOR-CON) 20 MEQ tablet Take 1 tablet (20 mEq total) by mouth daily. 02/03/18   Scot Jun, FNP  sertraline (ZOLOFT) 25 MG tablet Take 1 tablet (25 mg total) by mouth at bedtime. 01/27/18   Scot Jun, FNP    Family History Family History  Problem Relation Age of Onset  . Breast cancer Mother   . Breast cancer Sister   . Breast cancer Maternal Grandmother     Social History Social History   Tobacco Use  . Smoking status: Never Smoker  . Smokeless tobacco: Never Used  Substance Use Topics  .  Alcohol use: No    Comment: "once a month"  . Drug use: No     Allergies   Patient has no known allergies.   Review of Systems Review of Systems Constitutional: No fever no chills ENT: No nasal congestion earache or sore throat  Physical Exam Updated Vital Signs BP 133/62 (BP Location: Right Arm)   Pulse 60   Temp 98.3 F (36.8 C) (Oral)   Resp 16   LMP 04/25/2018 (Approximate)   SpO2 100%   Physical Exam Constitutional:      Comments: Patient is alert and nontoxic.  Clinically well in appearance.  HENT:     Head:     Comments: No facial swelling.  Face is symmetric.  No reproducible tenderness on the facial bones.  No trismus.  Patient has significant dental decay with multiple dental fractures inferior maxilla left.  Patient denies pain in this area at this time.  She has a severely fractured tooth nearly to the gumline left upper posterior molar.  This is area of reproducible  pain. Neck:     Musculoskeletal: Neck supple.  Cardiovascular:     Rate and Rhythm: Normal rate and regular rhythm.  Pulmonary:     Effort: Pulmonary effort is normal.     Breath sounds: Normal breath sounds.  Musculoskeletal: Normal range of motion.  Skin:    General: Skin is warm and dry.  Neurological:     General: No focal deficit present.     Mental Status: She is oriented to person, place, and time.     Coordination: Coordination normal.  Psychiatric:        Mood and Affect: Mood normal.      ED Treatments / Results  Labs (all labs ordered are listed, but only abnormal results are displayed) Labs Reviewed - No data to display  EKG None  Radiology No results found.  Procedures Dental Block Date/Time: 05/09/2018 2:16 PM Performed by: Charlesetta Shanks, MD Authorized by: Charlesetta Shanks, MD   Consent:    Consent obtained:  Verbal   Consent given by:  Patient   Risks discussed:  Allergic reaction, infection, nerve damage, swelling, pain and unsuccessful block   Alternatives discussed:  No treatment Indications:    Indications: dental pain   Location:    Block type:  Supraperiosteal   Supraperiosteal location:  Upper teeth   Upper teeth location:  12/LU 1st bicuspid Procedure details (see MAR for exact dosages):    Needle gauge:  27 G   Anesthetic injected:  Bupivacaine 0.5% w/o epi   Injection procedure:  Anatomic landmarks identified, introduced needle, incremental injection, negative aspiration for blood and anatomic landmarks palpated Post-procedure details:    Outcome:  Anesthesia achieved   Patient tolerance of procedure:  Tolerated well, no immediate complications   (including critical care time)  Medications Ordered in ED Medications  bupivacaine (MARCAINE) 0.5 % injection 10 mL (10 mLs Infiltration Given 05/09/18 1330)  ibuprofen (ADVIL,MOTRIN) tablet 800 mg (800 mg Oral Given 05/09/18 1329)  HYDROcodone-acetaminophen (NORCO/VICODIN) 5-325 MG per  tablet 2 tablet (2 tablets Oral Given 05/09/18 1329)     Initial Impression / Assessment and Plan / ED Course  I have reviewed the triage vital signs and the nursing notes.  Pertinent labs & imaging results that were available during my care of the patient were reviewed by me and considered in my medical decision making (see chart for details).       Patient presents with dental pain.  Dental block provided with good relief.  Patient placed on amoxicillin.  Dental follow-up and information provided.   Final Clinical Impressions(s) / ED Diagnoses   Final diagnoses:  Pain, dental  Apical abscess    ED Discharge Orders         Ordered    amoxicillin (AMOXIL) 500 MG tablet  3 times daily with meals     05/09/18 1422    ibuprofen (ADVIL,MOTRIN) 800 MG tablet  3 times daily     05/09/18 1422           Charlesetta Shanks, MD 05/09/18 1426

## 2018-05-10 ENCOUNTER — Encounter (HOSPITAL_COMMUNITY): Payer: Self-pay | Admitting: Emergency Medicine

## 2018-05-10 ENCOUNTER — Emergency Department (HOSPITAL_COMMUNITY)
Admission: EM | Admit: 2018-05-10 | Discharge: 2018-05-10 | Disposition: A | Discharge status: Home / Self Care | Payer: No Typology Code available for payment source | Attending: Emergency Medicine | Admitting: Emergency Medicine

## 2018-05-10 ENCOUNTER — Other Ambulatory Visit: Payer: Self-pay

## 2018-05-10 DIAGNOSIS — J45909 Unspecified asthma, uncomplicated: Secondary | ICD-10-CM | POA: Insufficient documentation

## 2018-05-10 DIAGNOSIS — K0889 Other specified disorders of teeth and supporting structures: Secondary | ICD-10-CM

## 2018-05-10 DIAGNOSIS — K032 Erosion of teeth: Secondary | ICD-10-CM | POA: Insufficient documentation

## 2018-05-10 DIAGNOSIS — Z79899 Other long term (current) drug therapy: Secondary | ICD-10-CM | POA: Insufficient documentation

## 2018-05-10 MED ORDER — LIDOCAINE VISCOUS HCL 2 % MT SOLN: 15.0000 mL | OROMUCOSAL | 2 refills | Status: AC | PRN

## 2018-05-10 NOTE — ED Provider Notes (Signed)
[facility] EMERGENCY DEPARTMENT Provider Note   CSN: [id number] Arrival date & time: 05/10/18  6812    History   Chief Complaint Chief Complaint  Patient presents with  . Dental Pain    HPI Marcia Pham is a 38 y.o. female.     HPI   Marcia Pham is a 38 y.o. female, with a history of anemia and asthma, presenting to the ED with dental pain.  She states she was seen 2/23 for left upper dental pain, received dental block and pain improved.  After she arrived home, her pain began to recur and she additionally began to have left lower dental pain. Pain is severe, achy, nonradiating.  Denies fever/chills, nausea/vomiting, facial swelling, difficulty swallowing or breathing, or any other complaints.     Past Medical History:  Diagnosis Date  . Anemia   . Asthma   . Migraines   . Shingles     Patient Active Problem List   Diagnosis Date Noted  . 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 Breast mass, right 01/20/2018  . Family history of breast cancer in first degree relative 01/20/2018  . 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 ARM SURGERY       OB History    Gravida  1   Para      Term      Preterm      AB  1   Living        SAB      TAB  1   Ectopic      Multiple      Live Births               Home Medications    Prior to Admission medications   Medication Sig Start Date End Date Taking? Authorizing Provider  amoxicillin (AMOXIL) 500 MG tablet Take 1 tablet (500 mg total) by mouth 3 (three) times daily with meals. 05/09/18   Charlesetta Shanks, MD  butalbital-acetaminophen-caffeine (FIORICET, ESGIC) [phone] MG tablet Take 1 tablet by mouth every 6 (six) hours as needed for headache. 01/27/18 01/27/19  Scot Jun, FNP  ferrous sulfate (FERROUSUL) 325 (65 FE) MG tablet Take 1 tablet (325 mg total) by mouth daily with breakfast. 01/29/18    Scot Jun, FNP  ibuprofen (ADVIL,MOTRIN) 800 MG tablet Take 1 tablet (800 mg total) by mouth 3 (three) times daily. 05/09/18   Charlesetta Shanks, MD  lidocaine (XYLOCAINE) 2 % solution Use as directed 15 mLs in the mouth or throat as needed for mouth pain. 05/10/18   [place], Shawn C, PA-C  potassium chloride SA (K-DUR,KLOR-CON) 20 MEQ tablet Take 1 tablet (20 mEq total) by mouth daily. 02/03/18   Scot Jun, FNP  sertraline (ZOLOFT) 25 MG tablet Take 1 tablet (25 mg total) by mouth at bedtime. 01/27/18   Scot Jun, FNP    Family History Family History  Problem Relation Age of Onset  . Breast cancer Mother   . Breast cancer Sister   . Breast cancer Maternal Grandmother     Social History Social History   Tobacco Use  . Smoking status: Never Smoker  . Smokeless tobacco: Never Used  Substance Use Topics  . Alcohol use: No    Comment: "once a month"  . Drug use: No     Allergies   Patient has no known allergies.   Review of Systems Review of Systems  Constitutional: Negative for fever.  HENT: Positive for dental problem. Negative for facial swelling.   Respiratory: Negative for shortness of breath.   Cardiovascular: Negative for chest pain.  Gastrointestinal: Negative for nausea and vomiting.  Musculoskeletal: Negative for neck pain and neck stiffness.     Physical Exam Updated Vital Signs BP 121/88 (BP Location: Right Arm)   Pulse 74   Temp 97.7 F (36.5 C) (Oral)   Resp 16   LMP 04/25/2018 (Approximate)   SpO2 100%   Physical Exam Vitals signs and nursing note reviewed.  Constitutional:      General: She is not in acute distress.    Appearance: She is well-developed. She is not diaphoretic.  HENT:     Head: Normocephalic and atraumatic.     Mouth/Throat:     Comments: Significant erosion to the left mandibular rearmost molar with associated tenderness. Tenderness to what appears to be one of the left maxillary premolars. No noted area of  swelling or fluctuance.  No trismus.  Mouth opening to at least 3 finger widths.  Handles oral secretions without difficulty.  No noted facial swelling.  No swelling or tenderness to the submental or submandibular regions.  No swelling or tenderness into the soft tissues of the neck. Eyes:     Conjunctiva/sclera: Conjunctivae normal.  Neck:     Musculoskeletal: Neck supple.  Cardiovascular:     Rate and Rhythm: Normal rate and regular rhythm.  Pulmonary:     Effort: Pulmonary effort is normal.  Lymphadenopathy:     Cervical: No cervical adenopathy.  Skin:    General: Skin is warm and dry.     Coloration: Skin is not pale.  Neurological:     Mental Status: She is alert.  Psychiatric:        Behavior: Behavior normal.      ED Treatments / Results  Labs (all labs ordered are listed, but only abnormal results are displayed) Labs Reviewed - No data to display  EKG None  Radiology No results found.  Procedures Dental Block Date/Time: 05/10/2018 4:22 AM Performed by: Lorayne Bender, PA-C Authorized by: Lorayne Bender, PA-C   Consent:    Consent obtained:  Verbal   Consent given by:  Patient   Risks discussed:  Hematoma, swelling, unsuccessful block and pain Indications:    Indications: dental pain   Location:    Block type:  Middle superior alveolar   Laterality:  Left Procedure details (see MAR for exact dosages):    Syringe type:  Controlled syringe   Needle gauge:  27 G   Anesthetic injected:  Bupivacaine 0.5% WITH epi   Injection procedure:  Anatomic landmarks identified, anatomic landmarks palpated, introduced needle, negative aspiration for blood and incremental injection Post-procedure details:    Outcome:  Anesthesia achieved   Patient tolerance of procedure:  Tolerated well, no immediate complications Dental Block Date/Time: 05/10/2018 4:27 AM Performed by: Lorayne Bender, PA-C Authorized by: Lorayne Bender, PA-C   Consent:    Consent obtained:  Verbal   Consent  given by:  Patient   Risks discussed:  Swelling, unsuccessful block, pain and hematoma Indications:    Indications: dental pain   Location:    Block type:  Inferior alveolar   Laterality:  Left Procedure details (see MAR for exact dosages):    Topical anesthetic:  Benzocaine gel   Syringe type:  Controlled syringe   Needle gauge:  27 G   Anesthetic injected:  Bupivacaine 0.5% WITH  epi   Injection procedure:  Anatomic landmarks identified, anatomic landmarks palpated, introduced needle, negative aspiration for blood and incremental injection Post-procedure details:    Outcome:  Anesthesia achieved   Patient tolerance of procedure:  Tolerated well, no immediate complications   (including critical care time)  Medications Ordered in ED Medications - No data to display   Initial Impression / Assessment and Plan / ED Course  I have reviewed the triage vital signs and the nursing notes.  Pertinent labs & imaging results that were available during my care of the patient were reviewed by me and considered in my medical decision making (see chart for details).        Patient presents with complaint of dental pain.  Doubt sepsis or Ludwig's angioedema.  Successful dental blocks performed. The patient was given instructions for home care as well as return precautions. Patient voices understanding of these instructions, accepts the plan, and is comfortable with discharge.   Final Clinical Impressions(s) / ED Diagnoses   Final diagnoses:  Pain, dental    ED Discharge Orders         Ordered    lidocaine (XYLOCAINE) 2 % solution  As needed     05/10/18 [place], Shawn C, PA-C 05/10/18 [place], Delice Bison, DO 05/10/18 [phone]

## 2018-05-10 NOTE — Discharge Instructions (Addendum)
°  Dental Pain You have been seen today for dental pain. You should follow up with a dentist as soon as possible. This problem will not resolve on its own without the care of a dentist. Use ibuprofen or naproxen for pain. Use the viscous lidocaine for mouth pain. Swish with the lidocaine and spit it out. Do not swallow it. You should also swish with a homemade salt water solution, twice a day.  Make this solution by mixing 8 ounces of warm water with about half a teaspoon of salt. Antiinflammatory medications: Take 600 mg of ibuprofen every 6 hours or 440 mg (over the counter dose) to 500 mg (prescription dose) of naproxen every 12 hours for the next 3 days. After this time, these medications may be used as needed for pain. Take these medications with food to avoid upset stomach. Choose only one of these medications, do not take them together. Acetaminophen (generic for Tylenol): Should you continue to have additional pain while taking the ibuprofen or naproxen, you may add in acetaminophen as needed. Your daily total maximum amount of acetaminophen from all sources should be limited to 4000mg /day for persons without liver problems, or 2000mg /day for those with liver problems.  Please take all of your antibiotics until finished!   You may develop abdominal discomfort or diarrhea from the antibiotic.  You may help offset this with probiotics which you can buy or get in yogurt. Do not eat or take the probiotics until 2 hours after your antibiotic.   For prescription assistance, may try using prescription discount sites or apps, such as goodrx.[place], [address] [place], [place] [address] [phone]  [place], [place] [address] [phone]  [place] [address] [place], [place] [address] [phone] ext. [place] [address] [place]  [address] [place], [place] [address] [phone]  [facility] [address] [place], [place] [address] [phone]  [facility] [employer] [email or web address]  [place] [address] [place], [place] [address] [phone]  Website for free, low-income, or sliding scale dental services in [place]: www.freedentalcare.[place]  To find a dentist in [place] and surrounding areas: CardCollectible.com.ee  [facility] [email or web address]  [facility] Medicaid Dentist [email or web address]

## 2018-05-10 NOTE — ED Triage Notes (Signed)
C/o L sided upper and lower dental pain x 5 days.  Seen in ED yesterday for same.

## 2018-05-16 ENCOUNTER — Emergency Department (HOSPITAL_COMMUNITY)
Admission: EM | Admit: 2018-05-16 | Discharge: 2018-05-16 | Disposition: A | Discharge status: Home / Self Care | Payer: No Typology Code available for payment source | Attending: Emergency Medicine | Admitting: Emergency Medicine

## 2018-05-16 ENCOUNTER — Other Ambulatory Visit: Payer: Self-pay

## 2018-05-16 DIAGNOSIS — Z79899 Other long term (current) drug therapy: Secondary | ICD-10-CM | POA: Insufficient documentation

## 2018-05-16 DIAGNOSIS — K047 Periapical abscess without sinus: Secondary | ICD-10-CM | POA: Insufficient documentation

## 2018-05-16 DIAGNOSIS — Z98818 Other dental procedure status: Secondary | ICD-10-CM | POA: Insufficient documentation

## 2018-05-16 DIAGNOSIS — J45909 Unspecified asthma, uncomplicated: Secondary | ICD-10-CM | POA: Insufficient documentation

## 2018-05-16 MED ORDER — CLINDAMYCIN HCL 150 MG PO CAPS
300.0000 mg | ORAL_CAPSULE | Freq: Once | ORAL | Status: AC
  Administered 2018-05-16: 300 mg via ORAL
  Filled 2018-05-16: qty 2

## 2018-05-16 MED ORDER — CLINDAMYCIN HCL 300 MG PO CAPS: 300.0000 mg | ORAL_CAPSULE | Freq: Three times a day (TID) | ORAL | 0 refills | Status: AC

## 2018-05-16 MED ORDER — BUPIVACAINE-EPINEPHRINE (PF) 0.5% -1:200000 IJ SOLN
1.8000 mL | Freq: Once | INTRAMUSCULAR | Status: DC
  Filled 2018-05-16: qty 1.8

## 2018-05-16 NOTE — ED Provider Notes (Signed)
[facility] EMERGENCY DEPARTMENT Provider Note   CSN: [id number] Arrival date & time: 05/16/18  0800    History   Chief Complaint Chief Complaint  Patient presents with  . Dental Pain    HPI Marcia Pham is a 38 y.o. female.     HPI  38 year old female presents with severe left mandibular dental pain.  She had 3 teeth removed, one maxillary and 2 mandibular on 2/24.  She states that she has been on amoxicillin since then.  She states that she is now having worsening pain and feels like there is something swollen on her gum that needs to be drained.  There has been no drainage.  No fevers, trouble breathing or swallowing.  She tried a topical anesthetic medicine but it burned the corner of her lip.  She is also been taking ibuprofen and Tylenol.  Past Medical History:  Diagnosis Date  . Anemia   . Asthma   . Migraines   . Shingles     Patient Active Problem List   Diagnosis Date Noted  . 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 Breast mass, right 01/20/2018  . Family history of breast cancer in first degree relative 01/20/2018  . 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 ARM SURGERY       OB History    Gravida  1   Para      Term      Preterm      AB  1   Living        SAB      TAB  1   Ectopic      Multiple      Live Births               Home Medications    Prior to Admission medications   Medication Sig Start Date End Date Taking? Authorizing Provider  amoxicillin (AMOXIL) 500 MG tablet Take 1 tablet (500 mg total) by mouth 3 (three) times daily with meals. 05/09/18   Charlesetta Shanks, MD  butalbital-acetaminophen-caffeine (FIORICET, ESGIC) [phone] MG tablet Take 1 tablet by mouth every 6 (six) hours as needed for headache. 01/27/18 01/27/19  Scot Jun, FNP  clindamycin (CLEOCIN) 300 MG capsule Take 1 capsule (300 mg total) by mouth 3 (three)  times daily for 7 days. 05/16/18 05/23/18  Sherwood Gambler, MD  ferrous sulfate (FERROUSUL) 325 (65 FE) MG tablet Take 1 tablet (325 mg total) by mouth daily with breakfast. 01/29/18   Scot Jun, FNP  ibuprofen (ADVIL,MOTRIN) 800 MG tablet Take 1 tablet (800 mg total) by mouth 3 (three) times daily. 05/09/18   Charlesetta Shanks, MD  lidocaine (XYLOCAINE) 2 % solution Use as directed 15 mLs in the mouth or throat as needed for mouth pain. 05/10/18   [place], Shawn C, PA-C  potassium chloride SA (K-DUR,KLOR-CON) 20 MEQ tablet Take 1 tablet (20 mEq total) by mouth daily. 02/03/18   Scot Jun, FNP  sertraline (ZOLOFT) 25 MG tablet Take 1 tablet (25 mg total) by mouth at bedtime. 01/27/18   Scot Jun, FNP    Family History Family History  Problem Relation Age of Onset  . Breast cancer Mother   . Breast cancer Sister   . Breast cancer Maternal Grandmother     Social History Social History   Tobacco Use  . Smoking status: Never Smoker  . Smokeless tobacco: Never Used  Substance Use Topics  . Alcohol use: No    Comment: "once a month"  . Drug use: No     Allergies   Patient has no known allergies.   Review of Systems Review of Systems  Constitutional: Negative for fever.  HENT: Positive for dental problem. Negative for trouble swallowing.   Respiratory: Negative for shortness of breath.   Musculoskeletal: Negative for neck pain.     Physical Exam Updated Vital Signs BP (!) 120/100   Pulse 74   Temp 98.2 F (36.8 C)   Resp 16   Ht 5' (1.524 m)   Wt 64.9 kg   LMP 04/25/2018 (Approximate)   SpO2 100%   BMI 27.93 kg/m   Physical Exam Vitals signs and nursing note reviewed.  Constitutional:      Appearance: She is well-developed.  HENT:     Head: Normocephalic and atraumatic.     Right Ear: External ear normal.     Left Ear: External ear normal.     Nose: Nose normal.     Mouth/Throat:     Dentition: Dental caries present. No dental abscesses.      Comments: I do not appreciate an obvious dental abscess. She is tender near the removed teeth but no swelling or color change. No drainage Eyes:     General:        Right eye: No discharge.        Left eye: No discharge.  Neck:     Musculoskeletal: Neck supple. No neck rigidity.  Cardiovascular:     Rate and Rhythm: Normal rate and regular rhythm.     Heart sounds: Normal heart sounds.  Pulmonary:     Effort: Pulmonary effort is normal.     Breath sounds: Normal breath sounds.  Abdominal:     General: There is no distension.  Skin:    General: Skin is warm and dry.  Neurological:     Mental Status: She is alert.  Psychiatric:        Mood and Affect: Mood is not anxious.      ED Treatments / Results  Labs (all labs ordered are listed, but only abnormal results are displayed) Labs Reviewed - No data to display  EKG None  Radiology No results found.  Procedures Dental Block Date/Time: 05/16/2018 9:23 AM Performed by: Sherwood Gambler, MD Authorized by: Sherwood Gambler, MD   Consent:    Consent obtained:  Verbal   Consent given by:  Patient Indications:    Indications: dental pain   Location:    Block type:  Inferior alveolar   Laterality:  Left Procedure details (see MAR for exact dosages):    Syringe type:  Controlled syringe   Needle gauge:  27 G   Anesthetic injected:  Bupivacaine 0.5% WITH epi   Injection procedure:  Anatomic landmarks identified, introduced needle, anatomic landmarks palpated and incremental injection Post-procedure details:    Patient tolerance of procedure:  Tolerated well, no immediate complications   (including critical care time)  Medications Ordered in ED Medications  bupivacaine-epinephrine (MARCAINE W/ EPI) 0.5% -1:200000 injection 1.8 mL (has no administration in time range)  clindamycin (CLEOCIN) capsule 300 mg (has no administration in time range)     Initial Impression / Assessment and Plan / ED Course  I have reviewed  the triage vital signs and the nursing notes.  Pertinent labs & imaging results that were available during my care of the patient were reviewed by me and considered in  my medical decision making (see chart for details).        I do not see an obvious drainable abscess.  While she is tender near her postoperative site, there is nothing clear to drain.  I think it is reasonable to increase her antibiotic strength and will put her on clindamycin.  She was also given dental block as above.  Follow-up with her dentist tomorrow.  Discussed return precautions.  Final Clinical Impressions(s) / ED Diagnoses   Final diagnoses:  Dental infection    ED Discharge Orders         Ordered    clindamycin (CLEOCIN) 300 MG capsule  3 times daily     05/16/18 9628           Sherwood Gambler, MD 05/16/18 1038

## 2018-05-16 NOTE — Discharge Instructions (Addendum)
Stop taking the amoxicillin.  Instead switch to the clindamycin.  Follow-up with the dentist tomorrow.  If you develop trouble breathing or swallowing, severe swelling, fever, vomiting, or any other new/concerning symptoms then return to the ER for evaluation.

## 2018-05-16 NOTE — ED Triage Notes (Signed)
Pt reports that she had three teeth pulled on the left side on Tuesday. Pt reports that she has had increased pain and swelling since then. Pt reports that she has been taking amoxicillin at home. Pt reports taking ibuprofen at home with no relief.

## 2018-05-31 ENCOUNTER — Emergency Department (HOSPITAL_COMMUNITY)
Admission: EM | Admit: 2018-05-31 | Discharge: 2018-05-31 | Disposition: A | Discharge status: Home / Self Care | Payer: No Typology Code available for payment source | Attending: Emergency Medicine | Admitting: Emergency Medicine

## 2018-05-31 ENCOUNTER — Other Ambulatory Visit: Payer: Self-pay

## 2018-05-31 DIAGNOSIS — R112 Nausea with vomiting, unspecified: Secondary | ICD-10-CM | POA: Insufficient documentation

## 2018-05-31 DIAGNOSIS — Z5321 Procedure and treatment not carried out due to patient leaving prior to being seen by health care provider: Secondary | ICD-10-CM | POA: Insufficient documentation

## 2018-05-31 LAB — CBC
HEMATOCRIT: 34 % — AB (ref 36.0–46.0)
Hemoglobin: 10.8 g/dL — ABNORMAL LOW (ref 12.0–15.0)
MCH: 26.5 pg (ref 26.0–34.0)
MCHC: 31.8 g/dL (ref 30.0–36.0)
MCV: 83.5 fL (ref 80.0–100.0)
Platelets: 399 10*3/uL (ref 150–400)
RBC: 4.07 MIL/uL (ref 3.87–5.11)
RDW: 13.8 % (ref 11.5–15.5)
WBC: 7.6 10*3/uL (ref 4.0–10.5)
nRBC: 0 % (ref 0.0–0.2)

## 2018-05-31 LAB — COMPREHENSIVE METABOLIC PANEL
ALK PHOS: 69 U/L (ref 38–126)
ALT: 28 U/L (ref 0–44)
AST: 21 U/L (ref 15–41)
Albumin: 3.6 g/dL (ref 3.5–5.0)
Anion gap: 10 (ref 5–15)
BILIRUBIN TOTAL: 0.6 mg/dL (ref 0.3–1.2)
BUN: 5 mg/dL — AB (ref 6–20)
CO2: 22 mmol/L (ref 22–32)
Calcium: 9 mg/dL (ref 8.9–10.3)
Chloride: 103 mmol/L (ref 98–111)
Creatinine, Ser: 0.68 mg/dL (ref 0.44–1.00)
GFR calc Af Amer: >60 mL/min (ref >60)
GFR calc non Af Amer: >60 mL/min (ref >60)
Glucose, Bld: 107 mg/dL — ABNORMAL HIGH (ref 70–99)
Potassium: 3.7 mmol/L (ref 3.5–5.1)
Sodium: 135 mmol/L (ref 135–145)
Total Protein: 6.5 g/dL (ref 6.5–8.1)

## 2018-05-31 LAB — URINALYSIS, ROUTINE W REFLEX MICROSCOPIC
Bilirubin Urine: NEGATIVE
Glucose, UA: NEGATIVE mg/dL
Hgb urine dipstick: NEGATIVE
Ketones, ur: NEGATIVE mg/dL
Leukocytes,Ua: NEGATIVE
Nitrite: NEGATIVE
Protein, ur: NEGATIVE mg/dL
Specific Gravity, Urine: 1.018 (ref 1.005–1.030)
pH: 8 (ref 5.0–8.0)

## 2018-05-31 LAB — I-STAT BETA HCG BLOOD, ED (MC, WL, AP ONLY): I-stat hCG, quantitative: <5 m[IU]/mL (ref <5)

## 2018-05-31 MED ORDER — SODIUM CHLORIDE 0.9% FLUSH: 3.0000 mL | Freq: Once | INTRAVENOUS | Status: DC

## 2018-05-31 NOTE — ED Triage Notes (Signed)
Pt reports N/V/D, cough, sore throat, SOB X few days. Denies sick contacts, denies recent travel. Subjective fevers.

## 2018-05-31 NOTE — ED Notes (Signed)
Patient seen walking out of Emergency Room, stating that they were leaving.

## 2018-06-03 ENCOUNTER — Encounter (HOSPITAL_COMMUNITY): Payer: Self-pay | Admitting: Emergency Medicine

## 2018-06-03 ENCOUNTER — Other Ambulatory Visit: Payer: Self-pay

## 2018-06-03 ENCOUNTER — Emergency Department (HOSPITAL_COMMUNITY)
Admission: EM | Admit: 2018-06-03 | Discharge: 2018-06-03 | Disposition: A | Discharge status: Home / Self Care | Payer: No Typology Code available for payment source | Attending: Emergency Medicine | Admitting: Emergency Medicine

## 2018-06-03 DIAGNOSIS — J45909 Unspecified asthma, uncomplicated: Secondary | ICD-10-CM | POA: Insufficient documentation

## 2018-06-03 DIAGNOSIS — Z79899 Other long term (current) drug therapy: Secondary | ICD-10-CM | POA: Insufficient documentation

## 2018-06-03 DIAGNOSIS — B349 Viral infection, unspecified: Secondary | ICD-10-CM | POA: Insufficient documentation

## 2018-06-03 DIAGNOSIS — R112 Nausea with vomiting, unspecified: Secondary | ICD-10-CM

## 2018-06-03 LAB — COMPREHENSIVE METABOLIC PANEL
ALT: 29 U/L (ref 0–44)
AST: 41 U/L (ref 15–41)
Albumin: 3.5 g/dL (ref 3.5–5.0)
Alkaline Phosphatase: 68 U/L (ref 38–126)
Anion gap: 11 (ref 5–15)
BUN: 11 mg/dL (ref 6–20)
CO2: 23 mmol/L (ref 22–32)
Calcium: 8.5 mg/dL — ABNORMAL LOW (ref 8.9–10.3)
Chloride: 100 mmol/L (ref 98–111)
Creatinine, Ser: 0.77 mg/dL (ref 0.44–1.00)
GFR calc Af Amer: >60 mL/min (ref >60)
GFR calc non Af Amer: >60 mL/min (ref >60)
Glucose, Bld: 99 mg/dL (ref 70–99)
Potassium: 4 mmol/L (ref 3.5–5.1)
Sodium: 134 mmol/L — ABNORMAL LOW (ref 135–145)
Total Bilirubin: 0.9 mg/dL (ref 0.3–1.2)
Total Protein: 7 g/dL (ref 6.5–8.1)

## 2018-06-03 LAB — CBC WITH DIFFERENTIAL/PLATELET
Abs Immature Granulocytes: 0 10*3/uL (ref 0.00–0.07)
Basophils Absolute: 0 10*3/uL (ref 0.0–0.1)
Basophils Relative: 1 %
Eosinophils Absolute: 0 10*3/uL (ref 0.0–0.5)
Eosinophils Relative: 0 %
HCT: 39.3 % (ref 36.0–46.0)
Hemoglobin: 12.2 g/dL (ref 12.0–15.0)
Lymphocytes Relative: 42 %
Lymphs Abs: 0.9 10*3/uL (ref 0.7–4.0)
MCH: 25.5 pg — ABNORMAL LOW (ref 26.0–34.0)
MCHC: 31 g/dL (ref 30.0–36.0)
MCV: 82.2 fL (ref 80.0–100.0)
Monocytes Absolute: 0.3 10*3/uL (ref 0.1–1.0)
Monocytes Relative: 14 %
Neutro Abs: 0.9 10*3/uL — ABNORMAL LOW (ref 1.7–7.7)
Neutrophils Relative %: 43 %
Platelets: 300 10*3/uL (ref 150–400)
RBC: 4.78 MIL/uL (ref 3.87–5.11)
RDW: 14.2 % (ref 11.5–15.5)
WBC: 2.2 10*3/uL — ABNORMAL LOW (ref 4.0–10.5)
nRBC: 0 % (ref 0.0–0.2)
nRBC: 1 /100 WBC — ABNORMAL HIGH

## 2018-06-03 LAB — POC URINE PREG, ED: Preg Test, Ur: NEGATIVE

## 2018-06-03 LAB — LIPASE, BLOOD: Lipase: 30 U/L (ref 11–51)

## 2018-06-03 MED ORDER — SODIUM CHLORIDE 0.9 % IV BOLUS
1000.0000 mL | Freq: Once | INTRAVENOUS | Status: AC
  Administered 2018-06-03: 1000 mL via INTRAVENOUS

## 2018-06-03 MED ORDER — ONDANSETRON 4 MG PO TBDP
4.0000 mg | ORAL_TABLET | Freq: Once | ORAL | Status: AC
  Administered 2018-06-03: 4 mg via ORAL
  Filled 2018-06-03: qty 1

## 2018-06-03 MED ORDER — ONDANSETRON HCL 4 MG PO TABS: 4.0000 mg | ORAL_TABLET | Freq: Four times a day (QID) | ORAL | 0 refills | Status: AC

## 2018-06-03 MED ORDER — ACETAMINOPHEN 325 MG PO TABS
650.0000 mg | ORAL_TABLET | Freq: Once | ORAL | Status: AC
  Administered 2018-06-03: 650 mg via ORAL
  Filled 2018-06-03: qty 2

## 2018-06-03 NOTE — ED Triage Notes (Signed)
N/v/d and fever x 1  Month came a few times for same but could not stay

## 2018-06-03 NOTE — Discharge Instructions (Addendum)
Clear liquids; medication for nausea; rest.

## 2018-06-03 NOTE — ED Provider Notes (Signed)
[facility] EMERGENCY DEPARTMENT Provider Note   CSN: [id number] Arrival date & time: 06/03/18  1306    History   Chief Complaint Chief Complaint  Patient presents with  . Vomiting    HPI Marcia Pham is a 38 y.o. female.     HPI   38 year old female presents today with complaints of nausea vomiting and diarrhea.  Patient notes symptoms started 2 days ago, she notes fever at home.  She notes nonbloody vomiting and diarrhea.  She also notes a cough but denies any significant upper respiratory complaints.  She denies any known close sick exposure, denies any travel.  No exposure to coronavirus positive patients.  Notes she has been unable to tolerate solids today.  No medications prior to arrival.  She denies any abnormal food or drink.  Past Medical History:  Diagnosis Date  . Anemia   . Asthma   . Migraines   . Shingles     Patient Active Problem List   Diagnosis Date Noted  . 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 Breast mass, right 01/20/2018  . Family history of breast cancer in first degree relative 01/20/2018  . Cluster B personality disorder ([place]) 07/14/2017    Past Surgical History:  Procedure Laterality Date  . ARM SURGERY       OB History    Gravida  1   Para      Term      Preterm      AB  1   Living        SAB      TAB  1   Ectopic      Multiple      Live Births               Home Medications    Prior to Admission medications   Medication Sig Start Date End Date Taking? Authorizing Provider  amoxicillin (AMOXIL) 500 MG tablet Take 1 tablet (500 mg total) by mouth 3 (three) times daily with meals. 05/09/18   Charlesetta Shanks, MD  butalbital-acetaminophen-caffeine (FIORICET, ESGIC) [phone] MG tablet Take 1 tablet by mouth every 6 (six) hours as needed for headache. 01/27/18 01/27/19  Scot Jun, FNP  ferrous sulfate (FERROUSUL) 325 (65 FE) MG tablet Take 1 tablet (325 mg total) by mouth daily  with breakfast. 01/29/18   Scot Jun, FNP  ibuprofen (ADVIL,MOTRIN) 800 MG tablet Take 1 tablet (800 mg total) by mouth 3 (three) times daily. 05/09/18   Charlesetta Shanks, MD  lidocaine (XYLOCAINE) 2 % solution Use as directed 15 mLs in the mouth or throat as needed for mouth pain. 05/10/18   Joy, Shawn C, PA-C  potassium chloride SA (K-DUR,KLOR-CON) 20 MEQ tablet Take 1 tablet (20 mEq total) by mouth daily. 02/03/18   Scot Jun, FNP  sertraline (ZOLOFT) 25 MG tablet Take 1 tablet (25 mg total) by mouth at bedtime. 01/27/18   Scot Jun, FNP    Family History Family History  Problem Relation Age of Onset  . Breast cancer Mother   . Breast cancer Sister   . Breast cancer Maternal Grandmother     Social History Social History   Tobacco Use  . Smoking status: Never Smoker  . Smokeless tobacco: Never Used  Substance Use Topics  . Alcohol use: No    Comment: "once a month"  . Drug use: No     Allergies   Patient has no known allergies.   Review of  Systems Review of Systems  All other systems reviewed and are negative.   Physical Exam Updated Vital Signs BP 120/86 (BP Location: Right Arm)   Pulse (!) 112   Temp 100.3 F (37.9 C) (Oral)   Resp 18   Ht 5' (1.524 m)   Wt 65.8 kg   SpO2 100%   BMI 28.32 kg/m   Physical Exam Vitals signs and nursing note reviewed.  Constitutional:      Appearance: She is well-developed.  HENT:     Head: Normocephalic and atraumatic.  Eyes:     General: No scleral icterus.       Right eye: No discharge.        Left eye: No discharge.     Conjunctiva/sclera: Conjunctivae normal.     Pupils: Pupils are equal, round, and reactive to light.  Neck:     Musculoskeletal: Normal range of motion.     Vascular: No JVD.     Trachea: No tracheal deviation.  Pulmonary:     Effort: Pulmonary effort is normal.     Breath sounds: No stridor.  Abdominal:     General: There is no distension.     Palpations: Abdomen is  soft.     Tenderness: There is no abdominal tenderness. There is no guarding.  Neurological:     Mental Status: She is alert and oriented to person, place, and time.     Coordination: Coordination normal.  Psychiatric:        Behavior: Behavior normal.        Thought Content: Thought content normal.        Judgment: Judgment normal.     ED Treatments / Results  Labs (all labs ordered are listed, but only abnormal results are displayed) Labs Reviewed  CBC WITH DIFFERENTIAL/PLATELET  COMPREHENSIVE METABOLIC PANEL  LIPASE, BLOOD    EKG None  Radiology No results found.  Procedures Procedures (including critical care time)  Medications Ordered in ED Medications  ondansetron (ZOFRAN-ODT) disintegrating tablet 4 mg (has no administration in time range)  sodium chloride 0.9 % bolus 1,000 mL (has no administration in time range)  acetaminophen (TYLENOL) tablet 650 mg (has no administration in time range)     Initial Impression / Assessment and Plan / ED Course  I have reviewed the triage vital signs and the nursing notes.  Pertinent labs & imaging results that were available during my care of the patient were reviewed by me and considered in my medical decision making (see chart for details).        Labs: CBC, CMP, lipase  Imaging:  Consults:  Therapeutics: Zofran  Discharge Meds:   Assessment/Plan: 38 year old female presents today with nausea vomiting and diarrhea.  Patient also having cough.  She is febrile here with slight tachycardia.  I do feel patient would benefit from IV fluids antiemetics.  Care will be signed to oncoming provider pending p.o. challenge and disposition.  She has a soft nontender abdomen, I have very low suspicion for acute intra-abdominal pathology including appendicitis, diverticulitis.   Final Clinical Impressions(s) / ED Diagnoses   Final diagnoses:  Viral illness    ED Discharge Orders    None       Francee Gentile  06/03/18 1414    Nat Christen, MD 06/03/18 [phone]

## 2018-06-05 LAB — PATHOLOGIST SMEAR REVIEW

## 2019-02-05 ENCOUNTER — Other Ambulatory Visit: Payer: Self-pay

## 2019-02-05 ENCOUNTER — Emergency Department (HOSPITAL_COMMUNITY)
Admission: EM | Admit: 2019-02-05 | Discharge: 2019-02-05 | Disposition: A | Discharge status: Home / Self Care | Payer: No Typology Code available for payment source | Attending: Emergency Medicine | Admitting: Emergency Medicine

## 2019-02-05 DIAGNOSIS — Y929 Unspecified place or not applicable: Secondary | ICD-10-CM | POA: Insufficient documentation

## 2019-02-05 DIAGNOSIS — Y999 Unspecified external cause status: Secondary | ICD-10-CM | POA: Insufficient documentation

## 2019-02-05 DIAGNOSIS — S0501XA Injury of conjunctiva and corneal abrasion without foreign body, right eye, initial encounter: Secondary | ICD-10-CM | POA: Insufficient documentation

## 2019-02-05 DIAGNOSIS — Y939 Activity, unspecified: Secondary | ICD-10-CM | POA: Insufficient documentation

## 2019-02-05 DIAGNOSIS — X58XXXA Exposure to other specified factors, initial encounter: Secondary | ICD-10-CM | POA: Insufficient documentation

## 2019-02-05 DIAGNOSIS — Z79899 Other long term (current) drug therapy: Secondary | ICD-10-CM | POA: Insufficient documentation

## 2019-02-05 DIAGNOSIS — J45909 Unspecified asthma, uncomplicated: Secondary | ICD-10-CM | POA: Insufficient documentation

## 2019-02-05 MED ORDER — OFLOXACIN 0.3 % OT SOLN: OTIC | 0 refills | Status: AC

## 2019-02-05 MED ORDER — TETRACAINE HCL 0.5 % OP SOLN
2.0000 [drp] | Freq: Once | OPHTHALMIC | Status: AC
  Administered 2019-02-05: 07:00:00 2 [drp] via OPHTHALMIC
  Filled 2019-02-05: qty 4

## 2019-02-05 MED ORDER — FLUORESCEIN SODIUM 1 MG OP STRP
1.0000 | ORAL_STRIP | Freq: Once | OPHTHALMIC | Status: AC
  Administered 2019-02-05: 1 via OPHTHALMIC
  Filled 2019-02-05: qty 1

## 2019-02-05 NOTE — ED Notes (Signed)
Patient verbalizes understanding of discharge instructions. Opportunity for questioning and answers were provided. Armband removed by staff, pt discharged from ED.  

## 2019-02-05 NOTE — ED Notes (Addendum)
Visual Acuity attempted at this time. Pt with red conjunctive of the right eye. States she is unable to see snellen chart and is "barely able to make out RN figure." Pt states she normally wears contacts, but this decrease in vision is not normal for her. Pt states she continues to feel eye pain and sensitivity to light.

## 2019-02-05 NOTE — ED Notes (Signed)
[place] lamp and [employer] at bedside. [place] PA aware

## 2019-02-05 NOTE — Discharge Instructions (Addendum)
Do not wear contact lenses while this heals.  Apply eyedrops to the right eye as directed.  Can apply cool compresses to help ease pain and swelling. You can take 1 to 2 tablets of Tylenol (350mg -1000mg  depending on the dose) every 6 hours as needed for pain.  Do not exceed 4000 mg of Tylenol daily.  If your pain persists you can take a doses of ibuprofen in between doses of Tylenol.  I usually recommend 400 to 600 mg of ibuprofen every 6 hours.  Take this with food to avoid upset stomach issues.  Follow-up with ophthalmology in the next 48 hours.  Return to the emergency department if any concerning signs or symptoms develop such as fevers, pain with eye movements, vision loss, severe swelling around the eye.

## 2019-02-05 NOTE — ED Triage Notes (Signed)
Pt reports feeling like there is an eyelash "or something" in her eye beginning yesterday morning, feels like a scratch/pain and it hurts when she looks at/goes into the light. Reports excess watering of the eye.

## 2019-02-05 NOTE — ED Provider Notes (Signed)
[facility] EMERGENCY DEPARTMENT Provider Note   CSN: [id number] Arrival date & time: 02/05/19  0524     History   Chief Complaint Chief Complaint  Patient presents with  . Eye Pain    HPI Marcia Pham is a 38 y.o. female with history of anemia, asthma, migraines, neurofibromatosis type I presents for evaluation of acute onset, progressively worsening right eye pain and redness.  Reports she woke yesterday morning with symptoms.  She does wear contact lenses that she sleeps in, removed her contact lens last night as her symptoms persisted.  She states that she feels as though she has an eyelash in her eye "but I cannot find it".  Notes clear tearful drainage, blurred vision as a result of persistent drainage.  Worsens with looking at bright lights. Denies fevers, eye swelling, or purulent drainage.  Her tetanus is up-to-date.     The history is provided by the patient.    Past Medical History:  Diagnosis Date  . Anemia   . Asthma   . Migraines   . Shingles     Patient Active Problem List   Diagnosis Date Noted  . 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 Breast mass, right 01/20/2018  . Family history of breast cancer in first degree relative 01/20/2018  . 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 ARM SURGERY       OB History    Gravida  1   Para      Term      Preterm      AB  1   Living        SAB      TAB  1   Ectopic      Multiple      Live Births               Home Medications    Prior to Admission medications   Medication Sig Start Date End Date Taking? Authorizing Provider  amoxicillin (AMOXIL) 500 MG tablet Take 1 tablet (500 mg total) by mouth 3 (three) times daily with meals. Patient not taking: Reported on 06/03/2018 05/09/18   Charlesetta Shanks, MD  ferrous sulfate (FERROUSUL) 325 (65 FE) MG tablet Take 1 tablet (325 mg total) by mouth daily  with breakfast. Patient not taking: Reported on 06/03/2018 01/29/18   Scot Jun, FNP  ibuprofen (ADVIL,MOTRIN) 800 MG tablet Take 1 tablet (800 mg total) by mouth 3 (three) times daily. Patient not taking: Reported on 06/03/2018 05/09/18   Charlesetta Shanks, MD  lidocaine (XYLOCAINE) 2 % solution Use as directed 15 mLs in the mouth or throat as needed for mouth pain. Patient not taking: Reported on 06/03/2018 05/10/18   Lorayne Bender, PA-C  ofloxacin (FLOXIN) 0.3 % OTIC solution Apply 2 drops every 4 hours for the first 2 days, then 2 drops every 6 hours for an additional 5 days 02/05/19   Rodell Perna A, PA-C  ondansetron (ZOFRAN) 4 MG tablet Take 1 tablet (4 mg total) by mouth every 6 (six) hours. 06/03/18   Nat Christen, MD  potassium chloride SA (K-DUR,KLOR-CON) 20 MEQ tablet Take 1 tablet (20 mEq total) by mouth daily. Patient not taking: Reported on 06/03/2018 02/03/18   Scot Jun, FNP  sertraline (ZOLOFT) 25 MG tablet Take 1 tablet (25 mg total) by mouth at bedtime. Patient not taking: Reported on 06/03/2018 01/27/18   Scot Jun,  FNP    Family History Family History  Problem Relation Age of Onset  . Breast cancer Mother   . Breast cancer Sister   . Breast cancer Maternal Grandmother     Social History Social History   Tobacco Use  . Smoking status: Never Smoker  . Smokeless tobacco: Never Used  Substance Use Topics  . Alcohol use: No    Comment: "once a month"  . Drug use: No     Allergies   Patient has no known allergies.   Review of Systems Review of Systems  Constitutional: Negative for chills and fever.  Eyes: Positive for photophobia, pain, discharge, redness and visual disturbance.     Physical Exam Updated Vital Signs BP 120/82 (BP Location: Right Arm)   Pulse 72   Temp 98.3 F (36.8 C) (Oral)   Resp 17   Ht 5' (1.524 m)   Wt 63.5 kg   SpO2 100%   BMI 27.34 kg/m   Physical Exam Vitals signs and nursing note reviewed.   Constitutional:      General: She is not in acute distress.    Appearance: She is well-developed.  HENT:     Head: Normocephalic and atraumatic.  Eyes:     General:        Left eye: No discharge.     Extraocular Movements: Extraocular movements intact.     Comments: Right eye with conjunctival injection, pupils miotic but responsive to light bilaterally.  No chemosis or proptosis but she does have consensual photophobia.  No pain or restriction with eye movements.  No periorbital swelling or erythema, no tenderness to palpation around the orbit.  Clear tearful drainage noted.  IOP on the right 19.  On fluorescein stain she has a small corneal abrasion in the 11 o'clock position near the pupil.  No dendritic lesions, no foreign bodies, no rust rings, Seidel sign absent.     Neck:     Vascular: No JVD.     Trachea: No tracheal deviation.  Cardiovascular:     Rate and Rhythm: Normal rate.  Pulmonary:     Effort: Pulmonary effort is normal.  Abdominal:     General: There is no distension.  Skin:    General: Skin is warm and dry.     Findings: No erythema.  Neurological:     Mental Status: She is alert.  Psychiatric:        Behavior: Behavior normal.      ED Treatments / Results  Labs (all labs ordered are listed, but only abnormal results are displayed) Labs Reviewed - No data to display  EKG None  Radiology No results found.  Procedures Procedures (including critical care time)  Medications Ordered in ED Medications  tetracaine (PONTOCAINE) 0.5 % ophthalmic solution 2 drop (2 drops Right Eye Given 02/05/19 0647)  fluorescein ophthalmic strip 1 strip (1 strip Right Eye Given 02/05/19 0647)     Initial Impression / Assessment and Plan / ED Course  I have reviewed the triage vital signs and the nursing notes.  Pertinent labs & imaging results that were available during my care of the patient were reviewed by me and considered in my medical decision making (see  chart for details).        Patient with right eye conjunctival injection, foreign body sensation, clear tearful drainage.  He is afebrile, vital signs are stable.  She is nontoxic in appearance.  No pain or restriction with eye movements, doubt optic neuritis or  orbital cellulitis.  Fluorescein stain shows corneal abrasion.  No evidence of HSV ophthalmicus, foreign body, corneal ulceration.  Will treat with oxacillin eyedrops as she is a contact lens wearer and it is a more affordable antibiotic drop.  Her tetanus is up-to-date.  Recommend follow-up with ophthalmology within 48 hours for reevaluation.  Discussed strict ED return precautions. Patient verbalized understanding of and agreement with plan and is safe for discharge home at this time.   Final Clinical Impressions(s) / ED Diagnoses   Final diagnoses:  Abrasion of right cornea, initial encounter    ED Discharge Orders         Ordered    ofloxacin (FLOXIN) 0.3 % OTIC solution     02/05/19 0714           Renita Papa, PA-C 02/05/19 0800    Mesner, Corene Cornea, MD 02/06/19 [phone]

## 2019-12-10 IMAGING — US ULTRASOUND RIGHT BREAST LIMITED
1 series · 6 of 6 positions shown · non-contrast
Comparison: Previous exam(s).

CLINICAL DATA: RIGHT breast lump, palpable for approximately 1
month. Strong family history of breast cancer.

EXAM:
DIGITAL DIAGNOSTIC BILATERAL MAMMOGRAM WITH CAD AND TOMO
ULTRASOUND RIGHT BREAST

[Series 1: ultrasound right breast limited · 0.05mm/px · 6 of 6 slices shown]
[im 1/6]
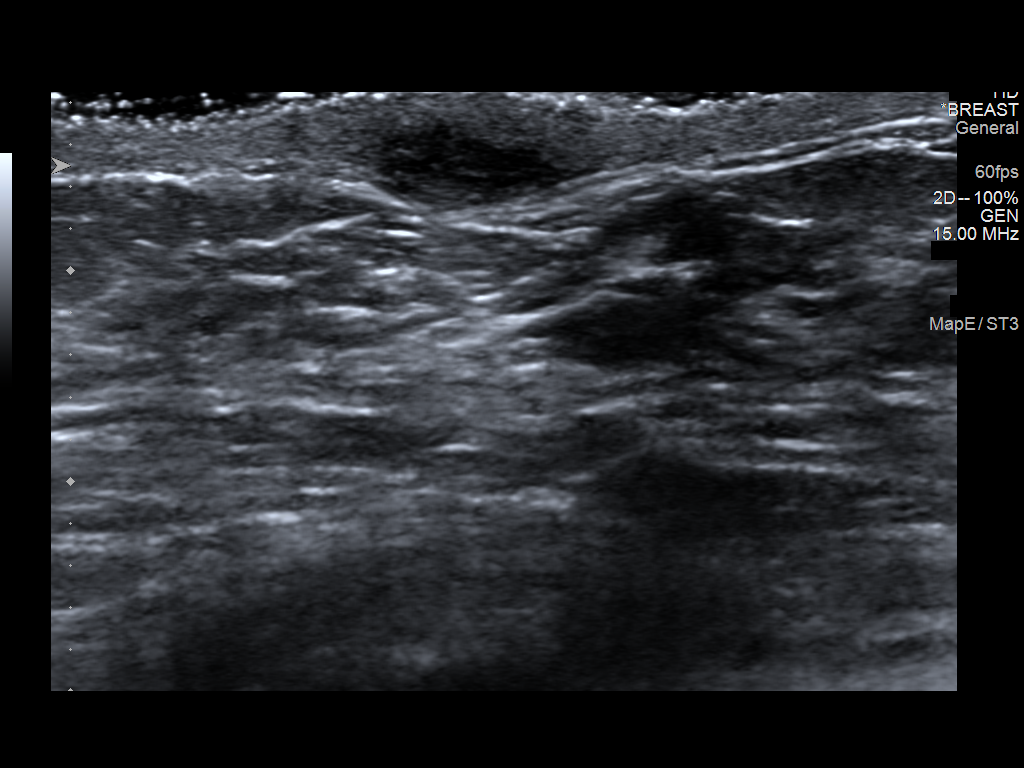
[im 2/6]
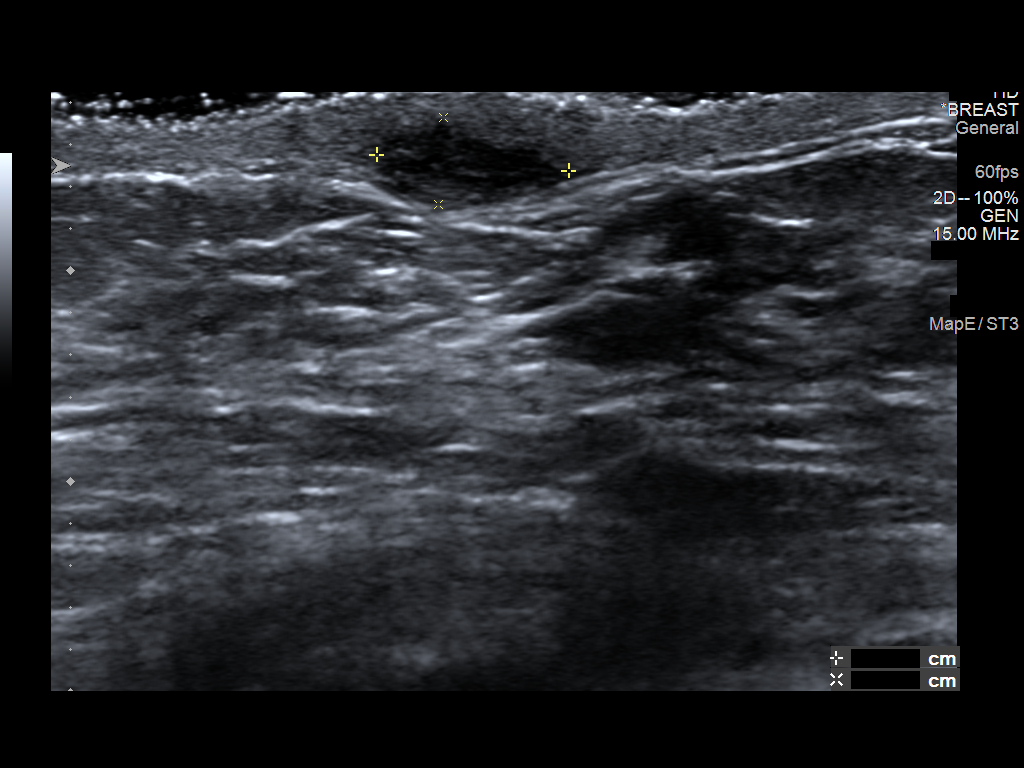
[im 3/6]
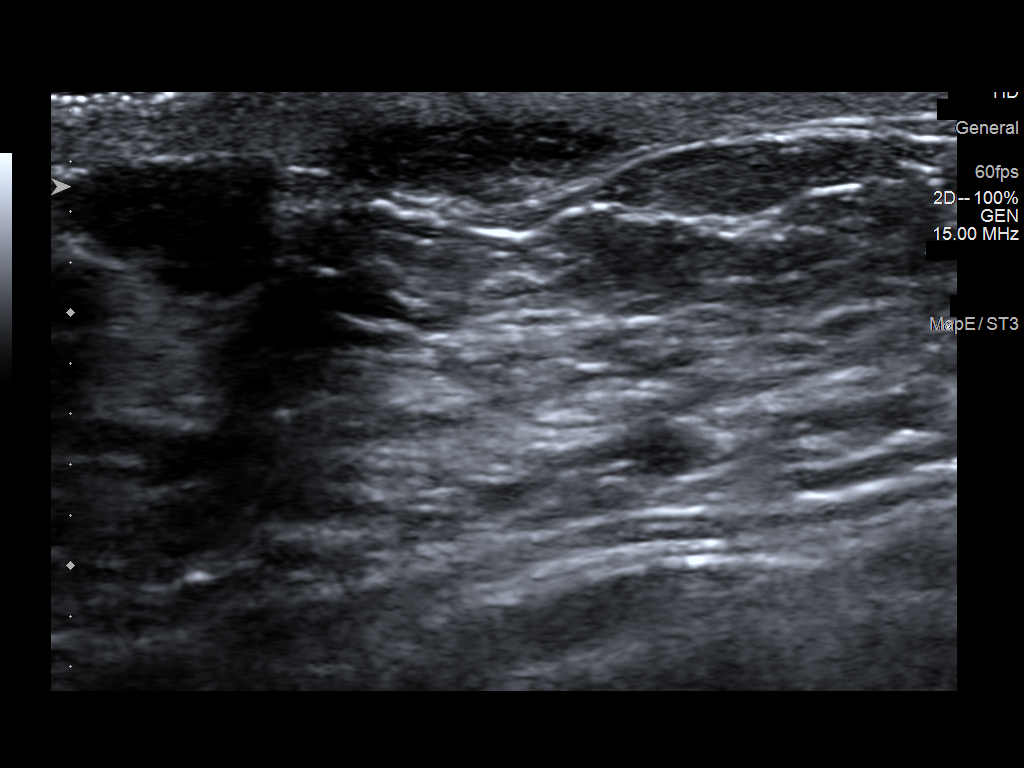
[im 4/6]
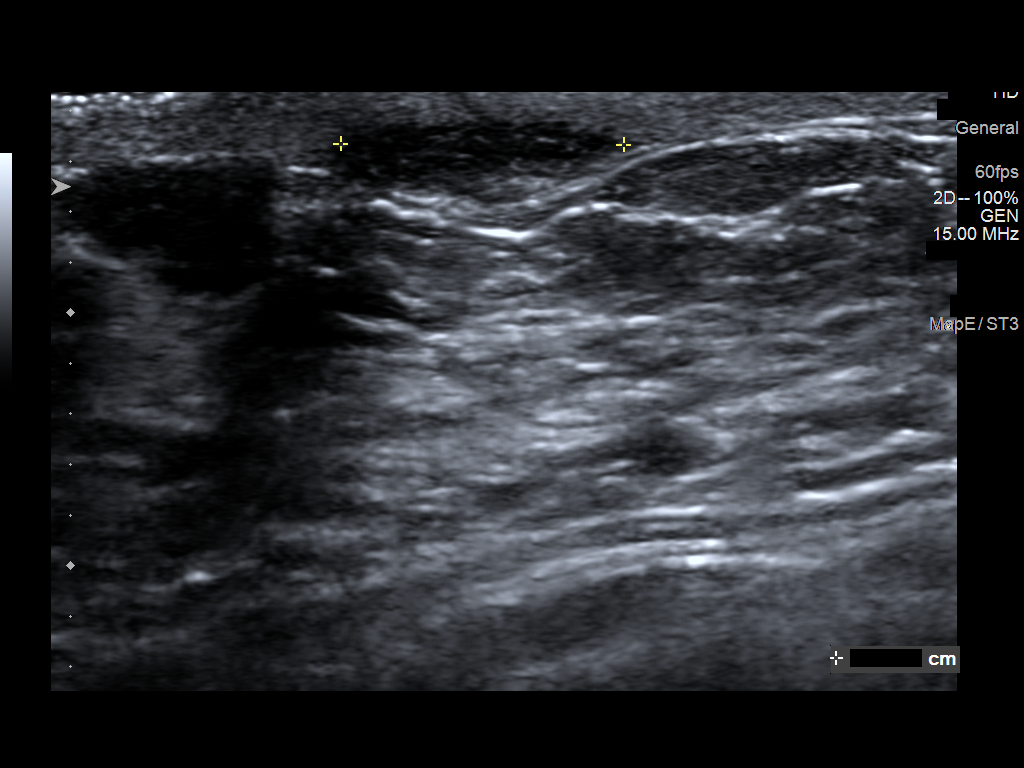
[im 5/6]
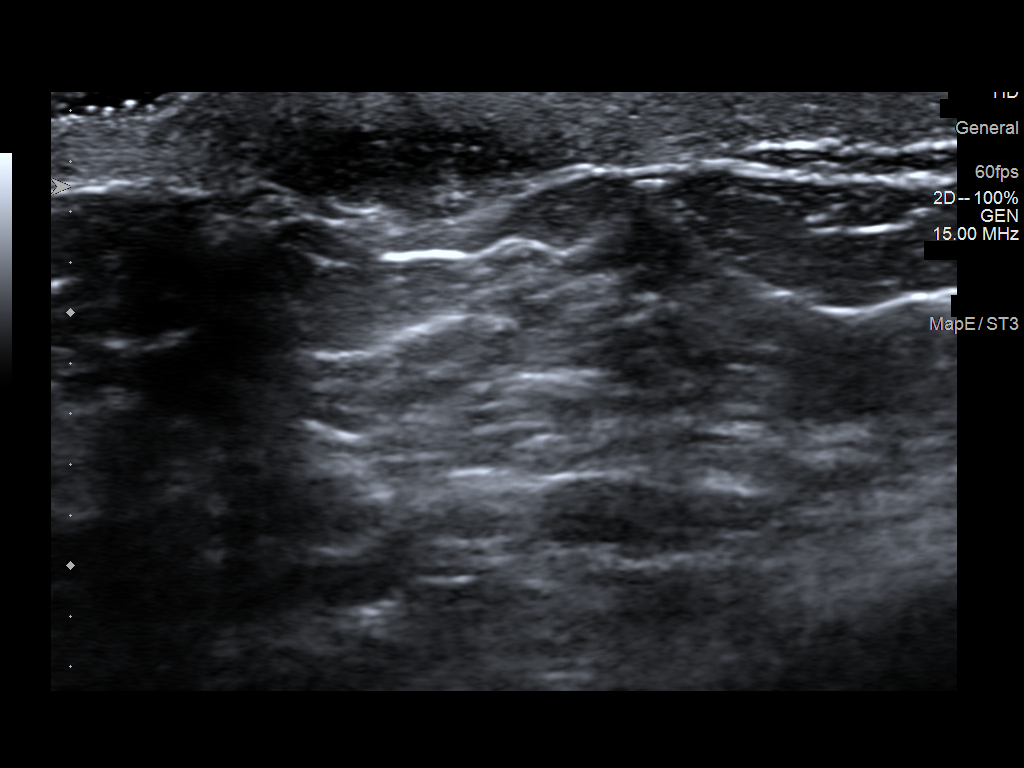
[im 6/6]
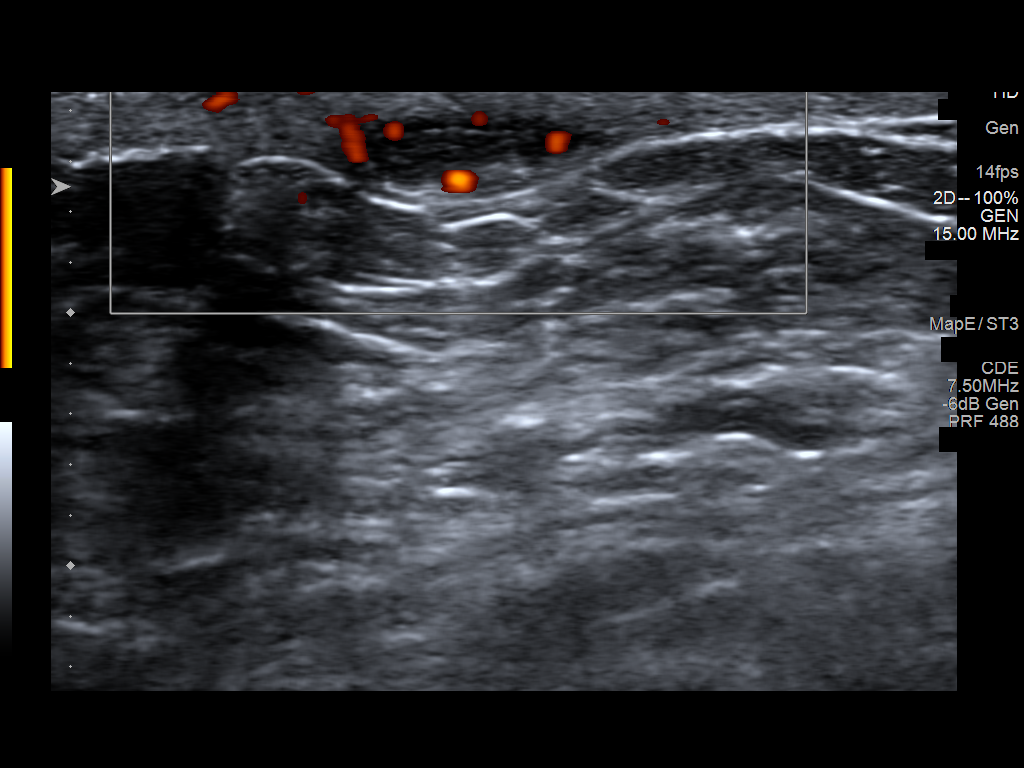

[6 of 6 positions shown; findings below may reference images not displayed]

ACR Breast Density Category c: The breast tissue is heterogeneously
dense, which may obscure small masses.
FINDINGS: Bilateral CC and MLO views were obtained today, with additional 3D
tomosynthesis, and with additional spot compression view of the
inner RIGHT breast corresponding to the area of clinical concern,
with overlying skin marker in place.

There are no dominant masses, suspicious calcifications or secondary
signs of malignancy within either breast. Specifically, there is no
mammographic abnormality within the inner RIGHT breast corresponding
to the area of clinical concern.

Mammographic images were processed with CAD.

Targeted ultrasound is performed, showing a benign sebaceous cyst
within the skin of the RIGHT breast at the 5 o'clock axis, 2 cm from
the nipple, measuring 11 mm in length and 4 mm thickness, with
internal debris, with surrounding skin thickening indicating
associated inflammation, corresponding to the area of clinical
concern.

No mass or fluid collection within the underlying breast.
IMPRESSION: 1. Benign sebaceous cyst localized to the skin of the RIGHT breast
at the 5 o'clock axis, measuring 11 x 4 mm, with internal debris and
surrounding skin thickening indicating associated inflammation,
corresponding to the area of clinical concern.
2. No evidence of malignancy within either breast.

RECOMMENDATION:
1. Screening mammogram at age 38 unless there are persistent or
intervening clinical concerns. (Code:TS-K-CTM)
2. Given patient's strong family history of breast cancer, would
consider the addition of annual screening breast MRI to annual
screening mammography. Per American Cancer Society guidelines,
annual screening MRI of the breasts is recommended if a risk
assessment calculation for breast cancer, preferably using the
Tyrer-Cuzick model, measures greater than 20%.
3. Patient was reassured that sebaceous cysts are benign and
typically self-limiting. Patient was instructed to use warm
compresses to expedite resolution. Patient was encouraged to
follow-up with referring physician if the palpable findings
increased or skin warmth developed.

I have discussed the findings and recommendations with the patient.
Results were also provided in writing at the conclusion of the
visit. If applicable, a reminder letter will be sent to the patient
regarding the next appointment.

BI-RADS CATEGORY  2: Benign.

## 2021-02-10 ENCOUNTER — Encounter (HOSPITAL_COMMUNITY): Payer: Self-pay

## 2021-02-10 ENCOUNTER — Other Ambulatory Visit: Payer: Self-pay

## 2021-02-10 ENCOUNTER — Emergency Department (HOSPITAL_COMMUNITY)
Admission: EM | Admit: 2021-02-10 | Discharge: 2021-02-10 | Disposition: A | Discharge status: Home / Self Care | Payer: BC Managed Care – PPO | Attending: Emergency Medicine | Admitting: Emergency Medicine

## 2021-02-10 DIAGNOSIS — M436 Torticollis: Secondary | ICD-10-CM | POA: Insufficient documentation

## 2021-02-10 DIAGNOSIS — J45909 Unspecified asthma, uncomplicated: Secondary | ICD-10-CM | POA: Diagnosis not present

## 2021-02-10 DIAGNOSIS — M542 Cervicalgia: Secondary | ICD-10-CM | POA: Diagnosis present

## 2021-02-10 MED ORDER — IBUPROFEN 600 MG PO TABS: 600.0000 mg | ORAL_TABLET | Freq: Four times a day (QID) | ORAL | 0 refills | Status: AC | PRN

## 2021-02-10 MED ORDER — METHOCARBAMOL 500 MG PO TABS: 500.0000 mg | ORAL_TABLET | Freq: Two times a day (BID) | ORAL | 0 refills | Status: AC | PRN

## 2021-02-10 MED ORDER — METHOCARBAMOL 500 MG PO TABS
1000.0000 mg | ORAL_TABLET | Freq: Once | ORAL | Status: AC
  Administered 2021-02-10: 16:00:00 1000 mg via ORAL
  Filled 2021-02-10: qty 2

## 2021-02-10 MED ORDER — DIAZEPAM 5 MG PO TABS
5.0000 mg | ORAL_TABLET | Freq: Once | ORAL | Status: AC
  Administered 2021-02-10: 16:00:00 5 mg via ORAL
  Filled 2021-02-10: qty 1

## 2021-02-10 MED ORDER — IBUPROFEN 400 MG PO TABS
600.0000 mg | ORAL_TABLET | Freq: Once | ORAL | Status: AC
  Administered 2021-02-10: 16:00:00 600 mg via ORAL
  Filled 2021-02-10: qty 1

## 2021-02-10 NOTE — ED Provider Notes (Signed)
Emergency Medicine Provider Triage Evaluation Note  Marcia Pham , a 40 y.o. female  was evaluated in triage.  Pt complains of neck stiffness.  Review of Systems  Positive: Neck stiffness Negative: Cold sxs, headache, trauma, fever  Physical Exam  BP 137/80   Pulse 63   Temp 98.2 F (36.8 C) (Oral)   Resp 16   SpO2 100%  Gen:   Awake, no distress   Resp:  Normal effort  MSK:   Moves extremities without difficulty  Other:    Medical Decision Making  Medically screening exam initiated at 1:50 PM.  Appropriate orders placed.  Marcia Pham was informed that the remainder of the evaluation will be completed by another provider, this initial triage assessment does not replace that evaluation, and the importance of remaining in the ED until their evaluation is complete.  Neck stiffness x 5 days.  No headache, fever or cold sxs.  Work at [employer], does heavy lifting.  I gave valium 5mg .    Domenic Moras, PA-C 02/10/21 1353    Wyvonnia Dusky, MD 02/10/21 1357

## 2021-02-10 NOTE — ED Provider Notes (Signed)
[facility] EMERGENCY DEPARTMENT Provider Note   CSN: [id number] Arrival date & time: 02/10/21  1243     History CC: Neck pain   Marcia Pham is a 40 y.o. female presenting the emergency department the right-sided neck pain.  She reports that she woke up with this pain about 5 days ago.  She reports her soreness along her right lateral and posterior neck, significantly worse with any lateral head movement.  She is never had the symptoms before.  She adamantly denies any trauma, falls, massage or manipulation of the neck.  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  She denies any persistent headache, numbness or weakness in her arms and legs.  She is not on blood thinners.  She has been taking Tylenol only at home, no other medications.  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    Past Surgical History:  Procedure Laterality Date   ARM SURGERY       OB History     Gravida  1   Para      Term      Preterm      AB  1   Living         SAB      IAB  1   Ectopic      Multiple      Live Births              Family History  Problem Relation Age of Onset   Breast cancer Mother    Breast cancer Sister    Breast cancer Maternal Grandmother     Social History   Tobacco Use   Smoking status: Never   Smokeless tobacco: Never  Vaping Use   Vaping Use: Never used  Substance Use Topics   Alcohol use: No    Comment: "once a month"   Drug use: No    Home Medications Prior to Admission medications   Medication Sig Start Date End Date Taking? Authorizing  Provider  ibuprofen (ADVIL) 600 MG tablet Take 1 tablet (600 mg total) by mouth every 6 (six) hours as needed for up to 30 doses for headache, mild pain or moderate pain. 02/10/21  Yes Annayah Worthley, Carola Rhine, MD  methocarbamol (ROBAXIN) 500 MG tablet Take 1 tablet (500 mg total) by mouth 2 (two) times daily as needed for up to 20 doses for muscle spasms. 02/10/21  Yes Baani Bober, Carola Rhine, MD  amoxicillin (AMOXIL) 500 MG tablet Take 1 tablet (500 mg total) by mouth 3 (three) times daily with meals. Patient not taking: Reported on 06/03/2018 05/09/18   Charlesetta Shanks, MD  ferrous sulfate (FERROUSUL) 325 (65 FE) MG tablet Take 1 tablet (325 mg total) by mouth daily with breakfast. Patient not taking: Reported on 06/03/2018 01/29/18   Scot Jun, FNP  ibuprofen (ADVIL,MOTRIN) 800 MG tablet Take 1 tablet (800 mg total) by mouth 3 (three) times daily. Patient not taking: Reported on 06/03/2018 05/09/18   Charlesetta Shanks, MD  lidocaine (XYLOCAINE) 2 % solution Use as directed 15  mLs in the mouth or throat as needed for mouth pain. Patient not taking: Reported on 06/03/2018 05/10/18   Lorayne Bender, PA-C  ofloxacin (FLOXIN) 0.3 % OTIC solution Apply 2 drops every 4 hours for the first 2 days, then 2 drops every 6 hours for an additional 5 days 02/05/19   Rodell Perna A, PA-C  ondansetron (ZOFRAN) 4 MG tablet Take 1 tablet (4 mg total) by mouth every 6 (six) hours. 06/03/18   Nat Christen, MD  potassium chloride SA (K-DUR,KLOR-CON) 20 MEQ tablet Take 1 tablet (20 mEq total) by mouth daily. Patient not taking: Reported on 06/03/2018 02/03/18   Scot Jun, FNP  sertraline (ZOLOFT) 25 MG tablet Take 1 tablet (25 mg total) by mouth at bedtime. Patient not taking: Reported on 06/03/2018 01/27/18   Scot Jun, FNP    Allergies    Patient has no known allergies.  Review of Systems   Review of Systems  Constitutional:  Negative for chills and fever.  Respiratory:  Negative for cough and shortness of  breath.   Cardiovascular:  Negative for chest pain and palpitations.  Gastrointestinal:  Negative for abdominal pain and vomiting.  Musculoskeletal:  Positive for arthralgias, myalgias, neck pain and neck stiffness.  Skin:  Negative for rash and wound.  Neurological:  Negative for weakness, light-headedness, numbness and headaches.  All other systems reviewed and are negative.  Physical Exam Updated Vital Signs BP (!) 149/95 (BP Location: Right Arm)   Pulse 61   Temp 98.2 F (36.8 C) (Oral)   Resp 18   SpO2 100%   Physical Exam Constitutional:      General: She is not in acute distress. HENT:     Head: Normocephalic and atraumatic.  Eyes:     Conjunctiva/sclera: Conjunctivae normal.     Pupils: Pupils are equal, round, and reactive to light.  Cardiovascular:     Rate and Rhythm: Normal rate and regular rhythm.     Pulses: Normal pulses.  Pulmonary:     Effort: Pulmonary effort is normal. No respiratory distress.  Musculoskeletal:     Comments: Right SCM tenderness, neck pain worse with lateral head movement (limited left lateral gaze)  Skin:    General: Skin is warm and dry.     Capillary Refill: Capillary refill takes less than 2 seconds.  Neurological:     General: No focal deficit present.     Mental Status: She is alert and oriented to person, place, and time. Mental status is at baseline.     Sensory: No sensory deficit.     Motor: No weakness.  Psychiatric:        Mood and Affect: Mood normal.        Behavior: Behavior normal.    ED Results / Procedures / Treatments   Labs (all labs ordered are listed, but only abnormal results are displayed) Labs Reviewed - No data to display  EKG None  Radiology No results found.  Procedures Procedures   Medications Ordered in ED Medications  diazepam (VALIUM) tablet 5 mg (5 mg Oral Given 02/10/21 1531)  methocarbamol (ROBAXIN) tablet 1,000 mg (1,000 mg Oral Given 02/10/21 1532)  ibuprofen (ADVIL) tablet 600 mg  (600 mg Oral Given 02/10/21 1531)    ED Course  I have reviewed the triage vital signs and the nursing notes.  Pertinent labs & imaging results that were available during my care of the patient were reviewed by me and considered in my medical decision making (  see chart for details).  Patient is here with reproducible neck stiffness on the right side for 5 days.  It is highly reproducible on exam, significantly worse with attempted left lateral gaze.  She does have tenderness along the SCM.  This is consistent with torticollis.  Have a much lower suspicion for meningitis with no fever, photophobia, or additional symptoms after 5 days.  Likewise have a low suspicion for vertebral artery dissection or injury with no traumatic mechanism to suggest that.  We did discuss beginning a muscle relaxer, ibuprofen, heating packs and or topical muscle rubs on the neck.  A work note will be provided.  The patient and her husband verbalized understanding and agreement the plan.   Final Clinical Impression(s) / ED Diagnoses Final diagnoses:  Torticollis    Rx / DC Orders ED Discharge Orders          Ordered    methocarbamol (ROBAXIN) 500 MG tablet  2 times daily PRN        02/10/21 1518    ibuprofen (ADVIL) 600 MG tablet  Every 6 hours PRN        02/10/21 1518             Wyvonnia Dusky, MD 02/10/21 2126

## 2021-02-10 NOTE — ED Triage Notes (Signed)
Patient complains of 4-5 days of right sided neck pain and stiffness. States that she does a lot of lifting as she works at [employer]. Has tried only tylenol for relief. No other associated symptoms

## 2021-02-18 ENCOUNTER — Encounter (HOSPITAL_COMMUNITY): Payer: Self-pay

## 2021-02-18 ENCOUNTER — Emergency Department (HOSPITAL_COMMUNITY)
Admission: EM | Admit: 2021-02-18 | Discharge: 2021-02-18 | Disposition: A | Discharge status: Home / Self Care | Payer: BC Managed Care – PPO | Attending: Emergency Medicine | Admitting: Emergency Medicine

## 2021-02-18 ENCOUNTER — Other Ambulatory Visit: Payer: Self-pay

## 2021-02-18 DIAGNOSIS — Z79899 Other long term (current) drug therapy: Secondary | ICD-10-CM | POA: Diagnosis not present

## 2021-02-18 DIAGNOSIS — D649 Anemia, unspecified: Secondary | ICD-10-CM | POA: Diagnosis not present

## 2021-02-18 DIAGNOSIS — T148XXA Other injury of unspecified body region, initial encounter: Secondary | ICD-10-CM

## 2021-02-18 DIAGNOSIS — J45909 Unspecified asthma, uncomplicated: Secondary | ICD-10-CM | POA: Diagnosis not present

## 2021-02-18 DIAGNOSIS — M549 Dorsalgia, unspecified: Secondary | ICD-10-CM | POA: Insufficient documentation

## 2021-02-18 DIAGNOSIS — M542 Cervicalgia: Secondary | ICD-10-CM | POA: Insufficient documentation

## 2021-02-18 LAB — POC URINE PREG, ED: Preg Test, Ur: NEGATIVE

## 2021-02-18 MED ORDER — CYCLOBENZAPRINE HCL 10 MG PO TABS: 10.0000 mg | ORAL_TABLET | Freq: Two times a day (BID) | ORAL | 0 refills | Status: AC | PRN

## 2021-02-18 MED ORDER — CYCLOBENZAPRINE HCL 10 MG PO TABS
10.0000 mg | ORAL_TABLET | Freq: Once | ORAL | Status: AC
  Administered 2021-02-18: 10 mg via ORAL
  Filled 2021-02-18: qty 1

## 2021-02-18 MED ORDER — KETOROLAC TROMETHAMINE 60 MG/2ML IM SOLN
60.0000 mg | Freq: Once | INTRAMUSCULAR | Status: AC
  Administered 2021-02-18: 60 mg via INTRAMUSCULAR
  Filled 2021-02-18: qty 2

## 2021-02-18 MED ORDER — LIDOCAINE 5 % EX PTCH
1.0000 | MEDICATED_PATCH | CUTANEOUS | Status: DC
  Administered 2021-02-18: 1 via TRANSDERMAL
  Filled 2021-02-18: qty 1

## 2021-02-18 NOTE — ED Triage Notes (Signed)
Pt presents with c/o neck and back pain for approx 2 weeks. Pt does work at [employer] and reports that she believes she may have injured it at work. Pt was seen for same approx 1.5 weeks ago and diagnosed with spasms but does not believe that is what it is.

## 2021-02-18 NOTE — ED Provider Notes (Signed)
Emergency Medicine Provider Triage Evaluation Note  Marcia Pham , a 40 y.o. female  was evaluated in triage.  Pt complains of neck and back pain for two weeks.    She has been trying ibuprofen, muscle relaxer's, and heat with out relief.    She denies shortness of breath  Review of Systems  Positive: Back and neck pain Negative: Fevers, shortness of breath  Physical Exam  BP 130/86   Pulse 67   Temp 98.5 F (36.9 C) (Oral)   Resp 16   LMP 02/12/2021 (Approximate)   SpO2 100%  Gen:   Awake, no distress   Resp:  Normal effort  MSK:   Moves extremities without difficulty  Other:  Normal speech , lungs ctab.  RRR, 2+ radial pulses bilaterally.  Sensation intact ble.  5/5 grip strength bilaterally.   Medical Decision Making  Medically screening exam initiated at 12:33 PM.  Appropriate orders placed.  Corah Willeford was informed that the remainder of the evaluation will be completed by another provider, this initial triage assessment does not replace that evaluation, and the importance of remaining in the ED until their evaluation is complete.     Lorin Glass, PA-C 02/18/21 1241    Dorie Rank, MD 02/20/21 1011

## 2021-02-18 NOTE — ED Provider Notes (Signed)
[facility] DEPT Provider Note   CSN: [id number] Arrival date & time: 02/18/21  0900     History Chief Complaint  Patient presents with   Neck Pain   Back Pain    Marcia Pham is a 40 y.o. female who presents to the ED today with complaint of gradual onset, constant, unchanged, achy, R lateral neck/shoulder/back pain for the past 2 weeks. Per chart review pt was seen in the ED for same on 11/27. She was diagnosed with torticollis and discharged home with Ibuprofen and Robaxin. She reports she has been taking it without relief and so she decided to stop taking it over the weekend. She states she is unsure if she is truly experiencing muscle spasms and wanted a second opinion/further eval given the amount of pain she is in. She states she has been unable to sleep much due to the pain. The pain is exacerbated with movement of her head both left and right. No weakness, numbness, tingling to extremities.   The history is provided by the patient and medical records.      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    Past Surgical History:  Procedure Laterality Date   ARM SURGERY       OB History     Gravida  1   Para      Term      Preterm      AB  1   Living         SAB      IAB  1   Ectopic      Multiple      Live Births              Family History  Problem Relation Age of Onset   Breast cancer Mother    Breast cancer Sister    Breast cancer Maternal Grandmother     Social History   Tobacco Use   Smoking status: Never   Smokeless tobacco: Never  Vaping Use   Vaping Use: Never used  Substance Use Topics   Alcohol use:  No    Comment: "once a month"   Drug use: No    Home Medications Prior to Admission medications   Medication Sig Start Date End Date Taking? Authorizing Provider  cyclobenzaprine (FLEXERIL) 10 MG tablet Take 1 tablet (10 mg total) by mouth 2 (two) times daily as needed for muscle spasms. 02/18/21  Yes Micaila Ziemba, PA-C  amoxicillin (AMOXIL) 500 MG tablet Take 1 tablet (500 mg total) by mouth 3 (three) times daily with meals. Patient not taking: Reported on 06/03/2018 05/09/18   Charlesetta Shanks, MD  ferrous sulfate (FERROUSUL) 325 (65 FE) MG tablet Take 1 tablet (325 mg total) by mouth daily with breakfast. Patient not taking: Reported on 06/03/2018 01/29/18   Scot Jun, FNP  ibuprofen (ADVIL) 600 MG tablet Take 1 tablet (600 mg total) by mouth every 6 (six) hours as needed for up to 30 doses for headache, mild pain or moderate pain. 02/10/21   Wyvonnia Dusky, MD  ibuprofen (ADVIL,MOTRIN) 800 MG tablet Take 1 tablet (800 mg total)  by mouth 3 (three) times daily. Patient not taking: Reported on 06/03/2018 05/09/18   Charlesetta Shanks, MD  lidocaine (XYLOCAINE) 2 % solution Use as directed 15 mLs in the mouth or throat as needed for mouth pain. Patient not taking: Reported on 06/03/2018 05/10/18   Joy, Helane Gunther, PA-C  methocarbamol (ROBAXIN) 500 MG tablet Take 1 tablet (500 mg total) by mouth 2 (two) times daily as needed for up to 20 doses for muscle spasms. 02/10/21   Wyvonnia Dusky, MD  ofloxacin (FLOXIN) 0.3 % OTIC solution Apply 2 drops every 4 hours for the first 2 days, then 2 drops every 6 hours for an additional 5 days 02/05/19   Rodell Perna A, PA-C  ondansetron (ZOFRAN) 4 MG tablet Take 1 tablet (4 mg total) by mouth every 6 (six) hours. 06/03/18   Nat Christen, MD  potassium chloride SA (K-DUR,KLOR-CON) 20 MEQ tablet Take 1 tablet (20 mEq total) by mouth daily. Patient not taking: Reported on 06/03/2018 02/03/18   Scot Jun, FNP  sertraline (ZOLOFT) 25 MG tablet Take 1  tablet (25 mg total) by mouth at bedtime. Patient not taking: Reported on 06/03/2018 01/27/18   Scot Jun, FNP    Allergies    Patient has no known allergies.  Review of Systems   Review of Systems  Constitutional:  Negative for chills and fever.  Musculoskeletal:  Positive for arthralgias, back pain and neck pain.  Neurological:  Negative for weakness and numbness.  All other systems reviewed and are negative.  Physical Exam Updated Vital Signs BP (!) 148/91   Pulse 65   Temp 98.5 F (36.9 C) (Oral)   Resp 16   LMP 02/12/2021 (Approximate)   SpO2 100%   Physical Exam Vitals and nursing note reviewed.  Constitutional:      Appearance: She is not ill-appearing or diaphoretic.  HENT:     Head: Normocephalic and atraumatic.  Eyes:     Conjunctiva/sclera: Conjunctivae normal.  Neck:      Comments: No C or T midline spinal TTP. + TTP along right SCM and trapezius. ROM of neck intact however pt reports "pulling" sensation to R trapezius with same. Strength 5/5 with grip strength bilaterally. Sensation intact throughout. 2+ radial pulse.  Cardiovascular:     Rate and Rhythm: Normal rate and regular rhythm.     Pulses: Normal pulses.  Pulmonary:     Effort: Pulmonary effort is normal.     Breath sounds: Normal breath sounds. No wheezing, rhonchi or rales.  Musculoskeletal:     Comments: No specific TTP to R shoulder joint. Negative Empty Can and Hawkin's test.   Skin:    General: Skin is warm and dry.     Coloration: Skin is not jaundiced.  Neurological:     Mental Status: She is alert.    ED Results / Procedures / Treatments   Labs (all labs ordered are listed, but only abnormal results are displayed) Labs Reviewed  POC URINE PREG, ED    EKG None  Radiology No results found.  Procedures Procedures   Medications Ordered in ED Medications  lidocaine (LIDODERM) 5 % 1 patch (1 patch Transdermal Patch Applied 02/18/21 1351)  ketorolac (TORADOL)  injection 60 mg (60 mg Intramuscular Given 02/18/21 1407)  cyclobenzaprine (FLEXERIL) tablet 10 mg (10 mg Oral Given 02/18/21 1407)    ED Course  I have reviewed the triage vital signs and the nursing notes.  Pertinent labs & imaging results that were available  during my care of the patient were reviewed by me and considered in my medical decision making (see chart for details).  Clinical Course as of 02/18/21 1446  Mon Feb 18, 2021  1402 Preg Test, Ur: NEGATIVE [MV]    Clinical Course User Index [MV] Eustaquio Maize, PA-C   MDM Rules/Calculators/A&P                           40 year old female who presents to the ED today with continued complaint of right neck/shoulder/back pain for the past 2 weeks.  No improvement after initial ED visit with Rx ibuprofen and Robaxin.  On arrival to the ED today vitals are stable.  Patient was medically screened and placed back in the waiting room.  Upon my examination she is noted to have some tenderness palpation to the right sternocleidomastoid, trapezius area.  She has good range of motion of her neck however does elicit some pain with lateral rotation of the neck with both left and right movement.  She is neurovascularly intact denies any weakness, numbness, tingling to bilateral upper extremities.  Given she is without trauma I do not feel she requires any imaging at this time.  Her pain does not seem to be from her shoulder she has active range of motion with a negative empty can and Hawkins testing.  She has no concerning symptoms today concerning for intracranial abnormalities including vertebral artery dissection, meningitis, spinal epidural abscess.  We will plan to treat with Toradol injection and Flexeril as well as lidocaine patch and reevaluate as I do suspect her pain is likely musculoskeletal in nature/related to muscle spasm/pulled muscle.  Do not feel patient requires narcotic pain medication for same.  On reevaluation pt reports significant  improvement in her symptoms. Will discharge home with flexeril Rx. Pt advised to continue taking Ibuprofen and to buy salonpas patches OTC. She is recommended to follow up with PCP - she is encouraged to call 1800 number on discharge paperwork to find PCP in the area. She is in agreement with plan and stable for discharge home.   This note was prepared using Dragon voice recognition software and may include unintentional dictation errors due to the inherent limitations of voice recognition software.   Final Clinical Impression(s) / ED Diagnoses Final diagnoses:  Neck pain  Muscle strain    Rx / DC Orders ED Discharge Orders          Ordered    cyclobenzaprine (FLEXERIL) 10 MG tablet  2 times daily PRN        02/18/21 1444             Discharge Instructions      Please pick up medication and take as prescribed. Discontinue use of the other muscle relaxer (robaxin). DO NOT DRIVE OR DRINK ALCOHOL WHILE ON THE MUSCLE RELAXER AS IT CAN MAKE YOU DROWSY.   Continue taking the Ibuprofen for pain.  You can also buy Salon Pas patches OTC to apply to your neck/back to help with pain.   Please call the 1800 number on this discharge paperwork (or your insurance card) to help find a PCP in your network for follow up purposes  Return to the ED for any new/worsening symptoms       Eustaquio Maize, PA-C 02/18/21 1446    Dorie Rank, MD 02/20/21 1011

## 2021-02-18 NOTE — Discharge Instructions (Signed)
Please pick up medication and take as prescribed. Discontinue use of the other muscle relaxer (robaxin). DO NOT DRIVE OR DRINK ALCOHOL WHILE ON THE MUSCLE RELAXER AS IT CAN MAKE YOU DROWSY.   Continue taking the Ibuprofen for pain.  You can also buy Salon Pas patches OTC to apply to your neck/back to help with pain.   Please call the 1800 number on this discharge paperwork (or your insurance card) to help find a PCP in your network for follow up purposes  Return to the ED for any new/worsening symptoms

## 2021-08-11 ENCOUNTER — Emergency Department (HOSPITAL_COMMUNITY)
Admission: EM | Admit: 2021-08-11 | Discharge: 2021-08-11 | Disposition: A | Discharge status: Home / Self Care | Payer: BC Managed Care – PPO | Attending: Emergency Medicine | Admitting: Emergency Medicine

## 2021-08-11 ENCOUNTER — Emergency Department (HOSPITAL_COMMUNITY): Payer: BC Managed Care – PPO

## 2021-08-11 ENCOUNTER — Encounter (HOSPITAL_COMMUNITY): Payer: Self-pay | Admitting: Emergency Medicine

## 2021-08-11 ENCOUNTER — Other Ambulatory Visit: Payer: Self-pay

## 2021-08-11 DIAGNOSIS — S0990XA Unspecified injury of head, initial encounter: Secondary | ICD-10-CM | POA: Diagnosis present

## 2021-08-11 DIAGNOSIS — S0085XA Superficial foreign body of other part of head, initial encounter: Secondary | ICD-10-CM | POA: Diagnosis not present

## 2021-08-11 DIAGNOSIS — W3400XA Accidental discharge from unspecified firearms or gun, initial encounter: Secondary | ICD-10-CM | POA: Diagnosis not present

## 2021-08-11 MED ORDER — CEPHALEXIN 500 MG PO CAPS: 500.0000 mg | ORAL_CAPSULE | Freq: Four times a day (QID) | ORAL | 0 refills | Status: AC
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
## 2022-01-18 ENCOUNTER — Emergency Department (HOSPITAL_COMMUNITY): Payer: BC Managed Care – PPO

## 2022-01-18 ENCOUNTER — Emergency Department (HOSPITAL_COMMUNITY)
Admission: EM | Admit: 2022-01-18 | Discharge: 2022-01-18 | Discharge status: Against Medical Advice | Payer: BC Managed Care – PPO | Attending: Emergency Medicine | Admitting: Emergency Medicine

## 2022-01-18 ENCOUNTER — Encounter (HOSPITAL_COMMUNITY): Payer: Self-pay | Admitting: *Deleted

## 2022-01-18 ENCOUNTER — Ambulatory Visit (HOSPITAL_COMMUNITY): Admission: RE | Admit: 2022-01-18 | Payer: BC Managed Care – PPO | Source: Ambulatory Visit

## 2022-01-18 ENCOUNTER — Other Ambulatory Visit: Payer: Self-pay

## 2022-01-18 DIAGNOSIS — R0989 Other specified symptoms and signs involving the circulatory and respiratory systems: Secondary | ICD-10-CM | POA: Diagnosis not present

## 2022-01-18 DIAGNOSIS — R079 Chest pain, unspecified: Secondary | ICD-10-CM | POA: Diagnosis not present

## 2022-01-18 DIAGNOSIS — R059 Cough, unspecified: Secondary | ICD-10-CM | POA: Insufficient documentation

## 2022-01-18 DIAGNOSIS — Z1152 Encounter for screening for COVID-19: Secondary | ICD-10-CM | POA: Diagnosis not present

## 2022-01-18 DIAGNOSIS — Z5321 Procedure and treatment not carried out due to patient leaving prior to being seen by health care provider: Secondary | ICD-10-CM | POA: Insufficient documentation

## 2022-01-18 LAB — SARS CORONAVIRUS 2 BY RT PCR: SARS Coronavirus 2 by RT PCR: NEGATIVE

## 2022-01-18 NOTE — ED Triage Notes (Signed)
Pt states that when she coughs, she has burning in her chest. Cough, runny nose, scratchy throat, "slight" headache for several days. Unsure of fevers. Pt has been taking cold and flu meds.

## 2022-01-18 NOTE — ED Provider Triage Note (Signed)
Emergency Medicine Provider Triage Evaluation Note  Marcia Pham , a 41 y.o. female  was evaluated in triage.  Pt complains of several days of cough productive of phlegm now with central chest burning only when she coughs.  No fevers or chills but congestion and rhinorrhea.  Review of Systems  Positive: As above Negative: Nausea vomiting diarrhea  Physical Exam  BP (!) 143/90   Pulse 80   Temp 98.3 F (36.8 C) (Oral)   Resp 16   Ht '5\' 1"'$  (1.549 m)   Wt 69.4 kg   SpO2 99%   BMI 28.91 kg/m  Gen:   Awake, no distress   Resp:  Normal effort  MSK:   Moves extremities without difficulty  Other:  RRR no M setters G.  Lungs CTA B with the exception of rhonchi in the right lower lobe  Medical Decision Making  Medically screening exam initiated at 4:58 AM.  Appropriate orders placed.  Onda Kattner was informed that the remainder of the evaluation will be completed by another provider, this initial triage assessment does not replace that evaluation, and the importance of remaining in the ED until their evaluation is complete.  This chart was dictated using voice recognition software, Dragon. Despite the best efforts of this provider to proofread and correct errors, errors may still occur which can change documentation meaning.    Emeline Darling, PA-C 01/18/22 [phone]

## 2022-01-18 NOTE — ED Notes (Signed)
Called pt x3 in [facility], checked triage and outside and pt was not found.
